# Patient Record
Sex: Male | Born: 1995 | Race: Black or African American | Hispanic: No | Marital: Single | Smoking: Never smoker
Health system: Southern US, Community
[De-identification: ages and names within clinical notes are randomized; demographics above are authoritative.]

## PROBLEM LIST (undated history)

## (undated) DIAGNOSIS — F329 Major depressive disorder, single episode, unspecified: Secondary | ICD-10-CM

## (undated) DIAGNOSIS — F32A Depression, unspecified: Secondary | ICD-10-CM

## (undated) DIAGNOSIS — K219 Gastro-esophageal reflux disease without esophagitis: Secondary | ICD-10-CM

## (undated) DIAGNOSIS — F200 Paranoid schizophrenia: Secondary | ICD-10-CM

## (undated) DIAGNOSIS — F419 Anxiety disorder, unspecified: Secondary | ICD-10-CM

## (undated) DIAGNOSIS — F209 Schizophrenia, unspecified: Secondary | ICD-10-CM

## (undated) DIAGNOSIS — J302 Other seasonal allergic rhinitis: Secondary | ICD-10-CM

## (undated) DIAGNOSIS — J45909 Unspecified asthma, uncomplicated: Secondary | ICD-10-CM

## (undated) HISTORY — DX: Schizophrenia, unspecified: F20.9

## (undated) HISTORY — DX: Unspecified asthma, uncomplicated: J45.909

## (undated) HISTORY — DX: Other seasonal allergic rhinitis: J30.2

## (undated) HISTORY — DX: Major depressive disorder, single episode, unspecified: F32.9

## (undated) HISTORY — DX: Gastro-esophageal reflux disease without esophagitis: K21.9

## (undated) HISTORY — PX: WISDOM TOOTH EXTRACTION: SHX21

## (undated) HISTORY — DX: Depression, unspecified: F32.A

## (undated) HISTORY — DX: Anxiety disorder, unspecified: F41.9

## (undated) HISTORY — DX: Paranoid schizophrenia: F20.0

---
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
## 2014-02-18 ENCOUNTER — Ambulatory Visit (HOSPITAL_COMMUNITY): Payer: Self-pay | Admitting: Psychiatry

## 2014-03-11 ENCOUNTER — Ambulatory Visit (INDEPENDENT_AMBULATORY_CARE_PROVIDER_SITE_OTHER): Payer: Medicaid Other | Admitting: Psychiatry

## 2014-03-11 ENCOUNTER — Encounter (HOSPITAL_COMMUNITY): Payer: Self-pay | Admitting: Psychiatry

## 2014-03-11 VITALS — BP 116/83 | HR 67 | Ht 68.5 in | Wt 224.0 lb

## 2014-03-11 DIAGNOSIS — F333 Major depressive disorder, recurrent, severe with psychotic symptoms: Secondary | ICD-10-CM | POA: Diagnosis not present

## 2014-03-11 MED ORDER — OLANZAPINE 2.5 MG PO TABS: ORAL_TABLET | ORAL | Status: DC

## 2014-03-11 MED ORDER — HYDROXYZINE PAMOATE 100 MG PO CAPS: 100.0000 mg | ORAL_CAPSULE | Freq: Every evening | ORAL | Status: DC | PRN

## 2014-03-11 NOTE — Progress Notes (Addendum)
   [facility] Follow-up Outpatient Visit  Zollie ScaleMichael D Arko 02/13/1996  Date:   03/10/14 Subjective: Pt is here for follow up Sleeping is fair; appetite is fair. Pt is now working during the summer.  Pt attends [employer], and making passing marks. 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 He is concerned with 85 lb weight gain with olanzapine. Pt doesn't feel he needs the medications. Pt reports that he does exercise. He says he is stress free. He denies SI/HI/AVH. Mom spoke to me in private, to tell provider that her son doesn't know his diagnosis, or why he is taking medications. Mom and son are well defended, and will continue to monitor patient's behavior. Agreed to lower olanzapine 2.5 mg hs; encouraged pt to eat healthy and to exercise. Pt denies depression, mania, or psychosis. He is tolerating the medications. Rtc in 4 weeks.   There were no vitals filed for this visit.  Mental Status Examination  Appearance: casual  Alert: Yes Attention: fair  Cooperative: Yes Eye Contact: Fair Speech: wdl  Psychomotor Activity: Normal Memory/Concentration: fair  Oriented: time/date and situation Mood: Anxious Affect: Constricted Thought Processes and Associations: Circumstantial Fund of Knowledge: Fair Thought Content: preoccupations Insight: Fair Judgement: Fair  Diagnosis:  MDD, recurrent, severe with psychosis  Treatment Plan:  Return to clinic in 4 weeks Olanzapine 2.5 mg po hs for psychosis Therapy Hydroxyzine 100 mg hs for sleep Kendrick FriesBLANKMANN, MEGHAN, NP

## 2014-04-16 ENCOUNTER — Ambulatory Visit (HOSPITAL_COMMUNITY): Payer: Self-pay | Admitting: Psychiatry

## 2014-05-15 ENCOUNTER — Ambulatory Visit (HOSPITAL_COMMUNITY): Payer: Self-pay | Admitting: Psychiatry

## 2014-05-26 ENCOUNTER — Ambulatory Visit (HOSPITAL_COMMUNITY): Payer: Self-pay | Admitting: Psychiatry

## 2014-07-27 ENCOUNTER — Other Ambulatory Visit (HOSPITAL_COMMUNITY): Payer: Self-pay | Admitting: *Deleted

## 2014-07-27 DIAGNOSIS — F333 Major depressive disorder, recurrent, severe with psychotic symptoms: Secondary | ICD-10-CM

## 2014-07-27 MED ORDER — OLANZAPINE 2.5 MG PO TABS: ORAL_TABLET | ORAL | Status: DC

## 2014-07-27 NOTE — Telephone Encounter (Signed)
Chart reviewed. Refill appropriate. Previous provider no longer with practice. Refilled per Dr. Remus BlakeKumar's orders

## 2014-08-04 ENCOUNTER — Encounter (HOSPITAL_COMMUNITY): Payer: Self-pay | Admitting: Psychiatry

## 2014-08-04 ENCOUNTER — Ambulatory Visit (INDEPENDENT_AMBULATORY_CARE_PROVIDER_SITE_OTHER): Payer: Medicaid Other | Admitting: Psychiatry

## 2014-08-04 VITALS — BP 123/73 | HR 62 | Ht 69.0 in | Wt 218.0 lb

## 2014-08-04 DIAGNOSIS — F3340 Major depressive disorder, recurrent, in remission, unspecified: Secondary | ICD-10-CM

## 2014-08-04 DIAGNOSIS — F200 Paranoid schizophrenia: Secondary | ICD-10-CM

## 2014-08-04 DIAGNOSIS — F333 Major depressive disorder, recurrent, severe with psychotic symptoms: Secondary | ICD-10-CM | POA: Insufficient documentation

## 2014-08-04 DIAGNOSIS — Z79899 Other long term (current) drug therapy: Secondary | ICD-10-CM

## 2014-08-04 MED ORDER — OLANZAPINE 5 MG PO TABS: ORAL_TABLET | ORAL | Status: DC

## 2014-08-04 MED ORDER — BUSPIRONE HCL 5 MG PO TABS: 10.0000 mg | ORAL_TABLET | Freq: Every day | ORAL | Status: DC

## 2014-08-04 NOTE — Progress Notes (Signed)
[facility] [id number] Progress Note  Michael Vega [id number] 18 y.o.  08/04/2014 10:33 AM  Chief Complaint: "I am good mentally, physically and emotionally"  History of Present Illness:Pt is a 18 yr old male with h/o MDD with psychosis and Schizophrenia paranoid type who presents today for a followup visit. He was last seen in June 2015.   Patient reports that he is doing well. Pt has taken a break from college for the last one month. He wants to think about what he wants to do in life before going back. He was laid off work b/c he wasn't able to handle the physical aspects of his job. Pt is looking for a new job.   He's also been taking his Zyprexa 5mg  as prescribed and denies SE.   Pt denies depression, low mood, isolation, anhedonia, worthlessness and crying spells.  He denies any paranoia, hallucinations at this visit.   States he is having trouble falling asleep and will fall asleep at 2am and will wake up 12 hrs later. He had stopped taking Vistaril for a while but tried it for the last 2 nights. It did not help him fall asleep any earlier.  Energy is good. Appetite is good.   Suicidal Ideation: No Plan Formed: No Patient has means to carry out plan: No  Homicidal Ideation: No Plan Formed: No Patient has means to carry out plan: No  Review of Systems: Psychiatric: Agitation: No Hallucination: No Depressed Mood: No Insomnia: No Hypersomnia: No Altered Concentration: No Feels Worthless: No Grandiose Ideas: No Belief In Special Powers: No New/Increased Substance Abuse: No Compulsions: No    Neurologic: Headache: No Seizure: No Paresthesias: No Review of Systems  Constitutional: Negative for fever and chills.  HENT: Negative for congestion, nosebleeds and sore throat.   Eyes: Negative for blurred vision, double vision and redness.  Respiratory: Negative for cough, sputum production and shortness of breath.   Cardiovascular: Negative for chest pain,  palpitations and leg swelling.  Gastrointestinal: Negative for heartburn, nausea, vomiting and abdominal pain.  Musculoskeletal: Negative for myalgias, back pain, joint pain and neck pain.  Skin: Negative for itching and rash.  Neurological: Negative for dizziness, tremors, seizures, weakness and headaches.  Psychiatric/Behavioral: Negative for depression, suicidal ideas, hallucinations and substance abuse. The patient has insomnia. The patient is not nervous/anxious.       Past Medical Family, Social History: lives with his mom, grandmother, and  Little sister.  reports that he has never smoked. He has never used smokeless tobacco. He reports that he does not drink alcohol or use illicit drugs.  Family History  Problem Relation Age of Onset  . ADD / ADHD Neg Hx   . Alcohol abuse Neg Hx   . Anxiety disorder Neg Hx   . Bipolar disorder Neg Hx   . Depression Neg Hx   . Schizophrenia Neg Hx    Past Medical History  Diagnosis Date  . Schizophrenia   . Depression      Outpatient Encounter Prescriptions as of 08/04/2014  Medication Sig  . hydrOXYzine (VISTARIL) 100 MG capsule Take 1 capsule (100 mg total) by mouth at bedtime as needed (sleep).  . OLANZapine (ZYPREXA) 2.5 MG tablet TAKE 1 TABLET (5 MG TOTAL) BY MOUTH AT BEDTIME.    Past Psychiatric History/Hospitalization(s): Anxiety: No Bipolar Disorder: No Depression: Yes Mania: No Psychosis: Yes Schizophrenia: Yes Personality Disorder: No Hospitalization for psychiatric illness: Yes History of Electroconvulsive Shock Therapy: No Prior Suicide Attempts: Yes  Physical  Exam: Constitutional:  BP 123/73 mmHg  Pulse 62  Ht 5\' 9"  (1.753 m)  Wt 218 lb (98.884 kg)  BMI 32.18 kg/m2  General Appearance: alert, oriented, no acute distress and well nourished  Musculoskeletal: Strength & Muscle Tone: within normal limits Gait & Station: normal Patient leans: N/A  Mental Status Examination/Evaluation: Objective: Attitude:  Calm and cooperative  Appearance: Fairly Groomed, appears to be stated age  Patent attorneyye Contact::  Fair  Speech:  Clear and Coherent and Normal Rate  Volume:  Normal  Mood:  euthymic  Affect:  Full Range  Thought Process:  Linear and Logical  Orientation:  Full (Time, Place, and Person)  Thought Content:  Negative  Suicidal Thoughts:  No  Homicidal Thoughts:  No  Judgement:  Fair  Insight:  Fair  Concentration: good  Memory: Immediate-fair Recent-fair Remote-fair  Recall: fair  Language: fair  Gait and Station: normal  [employer] of Knowledge: average  Psychomotor Activity:  Normal  Akathisia:  No  Handed:  Right  AIMS (if indicated):  Facial and Oral Movements  Muscles of Facial Expression: None, normal  Lips and Perioral Area: None, normal  Jaw: None, normal  Tongue: None, normal Extremity Movements: Upper (arms, wrists, hands, fingers): None, normal  Lower (legs, knees, ankles, toes): None, normal,  Trunk Movements:  Neck, shoulders, hips: None, normal,  Overall Severity : Severity of abnormal movements (highest score from questions above): None, normal  Incapacitation due to abnormal movements: None, normal  Patient's awareness of abnormal movements (rate only patient's report): No Awareness, Dental Status  Current problems with teeth and/or dentures?: No  Does patient usually wear dentures?: No    Assets:  Communication Skills Desire for Improvement Financial Resources/Insurance Housing Intimacy Leisure Time Physical Health Resilience Social Support Talents/Skills       Medical Decision Making (Choose Three): Established Problem, Stable/Improving (1), Review of Psycho-Social Stressors (1), Review of Last Therapy Session (1) and Review of Medication Regimen & Side Effects (2)  Assessment: Axis I: Schizophrenia, paranoid type; MDD- recurrent, severe with psychosis- in remission  Axis II: Deferred  Axis III:  Past Medical History  Diagnosis Date  .  Schizophrenia   . Depression    Axis IV: limited coping skills  Axis V: 70   Plan: Continue Zyprexa 5 MG PO qHS for depression and mood stabilization D/c Vistaril as is it no longer effect for sleep Start trial of Buspar 10mg  po qHS for sleep  Medication management with supportive therapy. Risks/benefits and SE of the medication discussed. Pt verbalized understanding and verbal consent obtained for treatment.  Affirm with the patient that the medications are taken as ordered. Patient expressed understanding of how their medications were to be used.  -improvement of symptoms  Labs: Antipsychotic- CBC, CMP, HbA1c, Lipid panel, TSH, Prolactin level, EKG  Therapy: brief supportive therapy provided. Discussed psychosocial stressors in detail.    Pt denies SI and is at an acute low risk for suicide.Patient told to call clinic if any problems occur. Patient advised to go to ER if they should develop SI/HI, side effects, or if symptoms worsen. Has crisis numbers to call if needed. Pt verbalized understanding.  F/up in 3 months or sooner if needed   Oletta DarterAGARWAL, Chalyn Amescua, MD 08/04/2014

## 2014-11-05 ENCOUNTER — Ambulatory Visit (HOSPITAL_COMMUNITY): Payer: Self-pay | Admitting: Psychiatry

## 2014-11-10 ENCOUNTER — Encounter (HOSPITAL_COMMUNITY): Payer: Self-pay | Admitting: Psychiatry

## 2014-11-10 ENCOUNTER — Ambulatory Visit (INDEPENDENT_AMBULATORY_CARE_PROVIDER_SITE_OTHER): Payer: Federal, State, Local not specified - PPO | Admitting: Psychiatry

## 2014-11-10 VITALS — BP 136/82 | HR 85 | Ht 68.5 in | Wt 225.0 lb

## 2014-11-10 DIAGNOSIS — F333 Major depressive disorder, recurrent, severe with psychotic symptoms: Secondary | ICD-10-CM

## 2014-11-10 DIAGNOSIS — F3342 Major depressive disorder, recurrent, in full remission: Secondary | ICD-10-CM | POA: Diagnosis not present

## 2014-11-10 DIAGNOSIS — Z79899 Other long term (current) drug therapy: Secondary | ICD-10-CM

## 2014-11-10 DIAGNOSIS — F200 Paranoid schizophrenia: Secondary | ICD-10-CM

## 2014-11-10 MED ORDER — OLANZAPINE 5 MG PO TABS: ORAL_TABLET | ORAL | Status: DC

## 2014-11-10 NOTE — Patient Instructions (Signed)
Call [phone] for EKG 

## 2014-11-10 NOTE — Progress Notes (Signed)
[facility] [id number] Progress Note  Michael Vega [id number] 19 y.o.  11/10/2014 4:23 PM  Chief Complaint: "I am good "  History of Present Illness:Pt is a 19 yr old male with h/o MDD with psychosis and Schizophrenia paranoid type who presents today for a followup visit.  Patient reports that he is doing well. Pt is taking a full course load at [employer] and is studying sociology. He enjoys it and classes are going well. 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   He's also been taking his Zyprexa  as prescribed and denies SE. Pt is not taking Buspar.   Pt denies depression, sad mood, isolation, anhedonia, worthlessness, hopelessness and crying spells.  He denies any paranoia, ideas of reference, hallucinations at this visit.   Sleep is good and is practicing good sleep hygeine. Pt is sleeping about 8 hrs/night. Energy is good and he is exercising a couple days a week. Appetite and concentration are good.   Suicidal Ideation: No Plan Formed: No Patient has means to carry out plan: No  Homicidal Ideation: No Plan Formed: No Patient has means to carry out plan: No  Review of Systems: Psychiatric: Agitation: No Hallucination: No Depressed Mood: No Insomnia: No Hypersomnia: No Altered Concentration: No Feels Worthless: No Grandiose Ideas: No Belief In Special Powers: No New/Increased Substance Abuse: No Compulsions: No    Neurologic: Headache: Yes rare Seizure: No Paresthesias: No Review of Systems  Constitutional: Negative for fever, chills, weight loss and malaise/fatigue.  HENT: Negative for congestion, nosebleeds and sore throat.   Eyes: Negative for blurred vision, double vision and redness.  Respiratory: Negative for cough, shortness of breath and wheezing.   Cardiovascular: Negative for chest pain, palpitations and leg swelling.  Gastrointestinal: Negative for heartburn, nausea, vomiting and abdominal pain.  Musculoskeletal:  Negative for back pain, joint pain and neck pain.  Skin: Negative for itching and rash.  Neurological: Positive for headaches. Negative for dizziness, focal weakness, seizures and loss of consciousness.  Psychiatric/Behavioral: Negative for depression, suicidal ideas, hallucinations and substance abuse. The patient is not nervous/anxious and does not have insomnia.       Past Medical Family, Social History: lives with his mom, grandmother, and  Little sister.  reports that he has never smoked. He has never used smokeless tobacco. He reports that he does not drink alcohol or use illicit drugs.  Family History  Problem Relation Age of Onset  . ADD / ADHD Neg Hx   . Alcohol abuse Neg Hx   . Anxiety disorder Neg Hx   . Bipolar disorder Neg Hx   . Depression Neg Hx   . Schizophrenia Neg Hx    Past Medical History  Diagnosis Date  . Schizophrenia   . Depression      Outpatient Encounter Prescriptions as of 11/10/2014  Medication Sig  . OLANZapine (ZYPREXA) 5 MG tablet TAKE 1 TABLET (5 MG TOTAL) BY MOUTH AT BEDTIME.  . busPIRone (BUSPAR) 5 MG tablet Take 2 tablets (10 mg total) by mouth at bedtime. (Patient not taking: Reported on 11/10/2014)    Past Psychiatric History/Hospitalization(s): Anxiety: No Bipolar Disorder: No Depression: Yes Mania: No Psychosis: Yes Schizophrenia: Yes Personality Disorder: No Hospitalization for psychiatric illness: Yes History of Electroconvulsive Shock Therapy: No Prior Suicide Attempts: Yes  Physical Exam: Constitutional:  BP 136/82 mmHg  Pulse 85  Ht 5' 8.5" (1.74 m)  Wt 225 lb (102.059 kg)  BMI 33.71 kg/m2  General Appearance: alert,  oriented, no acute distress and well nourished  Musculoskeletal: Strength & Muscle Tone: within normal limits Gait & Station: normal Patient leans: N/A  Mental Status Examination/Evaluation: Objective: Attitude: Calm and cooperative  Appearance: Fairly Groomed, appears to be stated age  Patent attorneyye Contact::   Fair  Speech:  Clear and Coherent and Normal Rate  Volume:  Normal  Mood:  euthymic  Affect:  Full Range  Thought Process:  Linear and Logical  Orientation:  Full (Time, Place, and Person)  Thought Content:  Negative  Suicidal Thoughts:  No  Homicidal Thoughts:  No  Judgement:  Fair  Insight:  Fair  Concentration: good  Memory: Immediate-fair Recent-fair Remote-fair  Recall: fair  Language: fair  Gait and Station: normal  [employer] of Knowledge: average  Psychomotor Activity:  Normal  Akathisia:  No  Handed:  Right  AIMS (if indicated):  Facial and Oral Movements  Muscles of Facial Expression: None, normal  Lips and Perioral Area: None, normal  Jaw: None, normal  Tongue: None, normal Extremity Movements: Upper (arms, wrists, hands, fingers): None, normal  Lower (legs, knees, ankles, toes): None, normal,  Trunk Movements:  Neck, shoulders, hips: None, normal,  Overall Severity : Severity of abnormal movements (highest score from questions above): None, normal  Incapacitation due to abnormal movements: None, normal  Patient's awareness of abnormal movements (rate only patient's report): No Awareness, Dental Status  Current problems with teeth and/or dentures?: No  Does patient usually wear dentures?: No    Assets:  Communication Skills Desire for Improvement Financial Resources/Insurance Housing Intimacy Leisure Time Physical Health Resilience Social Support Talents/Skills       Medical Decision Making (Choose Three): Established Problem, Stable/Improving (1), Review of Psycho-Social Stressors (1), Review or order clinical lab tests (1), Order AIMS Test (2) and Review of Medication Regimen & Side Effects (2)  Assessment: Axis I: Schizophrenia, paranoid type; MDD- recurrent, severe with psychosis- in remission  Axis II: Deferred  Axis III:  Past Medical History  Diagnosis Date  . Schizophrenia   . Depression    Axis IV: limited coping skills  Axis  V: 70   Plan: Continue Zyprexa 5 MG PO qHS for depression and mood stabilization D/c  Buspar 10mg  po qHS for sleep  Medication management with supportive therapy. Risks/benefits and SE of the medication discussed. Pt verbalized understanding and verbal consent obtained for treatment.  Affirm with the patient that the medications are taken as ordered. Patient expressed understanding of how their medications were to be used.  -improvement of symptoms  Labs: Antipsychotic- CBC, CMP, HbA1c, Lipid panel, TSH, Prolactin level, EKG  Therapy: brief supportive therapy provided. Discussed psychosocial stressors in detail.    Pt denies SI and is at an acute low risk for suicide.Patient told to call clinic if any problems occur. Patient advised to go to ER if they should develop SI/HI, side effects, or if symptoms worsen. Has crisis numbers to call if needed. Pt verbalized understanding.  F/up in 3 months or sooner if needed   Oletta DarterAGARWAL, Darshawn Boateng, MD 11/10/2014

## 2015-02-09 ENCOUNTER — Ambulatory Visit (HOSPITAL_COMMUNITY): Payer: Self-pay | Admitting: Psychiatry

## 2015-02-15 ENCOUNTER — Other Ambulatory Visit (HOSPITAL_COMMUNITY): Payer: Self-pay | Admitting: Psychiatry

## 2015-02-23 ENCOUNTER — Ambulatory Visit (HOSPITAL_COMMUNITY): Payer: Self-pay | Admitting: Psychiatry

## 2015-03-05 ENCOUNTER — Ambulatory Visit (INDEPENDENT_AMBULATORY_CARE_PROVIDER_SITE_OTHER): Payer: Federal, State, Local not specified - PPO | Admitting: Family Medicine

## 2015-03-05 ENCOUNTER — Encounter: Payer: Self-pay | Admitting: Family Medicine

## 2015-03-05 VITALS — BP 108/60 | HR 68 | Temp 97.5°F | Resp 16 | Ht 69.0 in | Wt 222.4 lb

## 2015-03-05 DIAGNOSIS — F333 Major depressive disorder, recurrent, severe with psychotic symptoms: Secondary | ICD-10-CM

## 2015-03-05 NOTE — Progress Notes (Signed)
Subjective:     Patient ID: Michael Vega, male   DOB: 06-27-1996, 19 y.o.   MRN: [id number]  HPI  Chief Complaint  Patient presents with  . Follow-up    Patient is here in office to follow up from psychiatrist. Patient is currently on Zyprexa 5mg  and is being monitored, specialist is requesting that PCP order EKG and labs.   States he feels well with symptoms controlled by medication. 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   Review of Systems  Respiratory: Negative for shortness of breath.   Cardiovascular: Negative for chest pain and palpitations.       Objective:   Physical Exam  Constitutional: He appears well-developed and well-nourished. No distress.  Cardiovascular: Normal rate and regular rhythm.   Pulmonary/Chest: Breath sounds normal.  Musculoskeletal: He exhibits no edema (in lower extremties).       Assessment:     1. MDD (major depressive disorder), recurrent, severe, with psychosis  - EKG 12-Lead - CBC with Differential/Platelet - COMPLETE METABOLIC PANEL WITH GFR - Prolactin - Lipid panel - Hemoglobin A1c    Plan:     Further f/u pending lab work. Copy of EKG sent with patient for psychiatrist review

## 2015-03-09 ENCOUNTER — Encounter (HOSPITAL_COMMUNITY): Payer: Self-pay | Admitting: Psychiatry

## 2015-03-09 ENCOUNTER — Ambulatory Visit (INDEPENDENT_AMBULATORY_CARE_PROVIDER_SITE_OTHER): Payer: Federal, State, Local not specified - PPO | Admitting: Psychiatry

## 2015-03-09 VITALS — BP 126/76 | HR 75 | Ht 69.0 in | Wt 221.4 lb

## 2015-03-09 DIAGNOSIS — F3342 Major depressive disorder, recurrent, in full remission: Secondary | ICD-10-CM

## 2015-03-09 DIAGNOSIS — F200 Paranoid schizophrenia: Secondary | ICD-10-CM

## 2015-03-09 MED ORDER — OLANZAPINE 5 MG PO TABS: ORAL_TABLET | ORAL | Status: DC

## 2015-03-09 NOTE — Progress Notes (Signed)
Patient ID: Michael Vega, male   DOB: May 03, 1996, 19 y.o.   MRN: [id number] [facility] [id number] Progress Note  Michael Vega [id number] 19 y.o.  03/09/2015 11:09 AM  Chief Complaint: "I am good "  History of Present Illness:Pt is a 19 yr old male with h/o MDD with psychosis and Schizophrenia paranoid type who presents today for a followup visit.  Patient reports that he is doing well. Pt is out for the summer from [facility]. He is still thinking about the future and trying to decide if he wants to go to college. 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   He's also been taking his Zyprexa 5mg  as prescribed and denies SE.   Pt denies depression, sad mood, isolation, anhedonia, worthlessness, hopelessness and crying spells.  He denies any anxiety,  paranoia, ideas of reference, hallucinations at this visit.   Sleep is good and is practicing good sleep hygeine. Pt is sleeping about 8-10 hrs/night. Energy is good. Appetite and concentration are good. No longer drinking sodas.   Suicidal Ideation: No Plan Formed: No Patient has means to carry out plan: No  Homicidal Ideation: No Plan Formed: No Patient has means to carry out plan: No  Review of Systems: Psychiatric: Agitation: No Hallucination: No Depressed Mood: No Insomnia: No Hypersomnia: No Altered Concentration: No Feels Worthless: No Grandiose Ideas: No Belief In Special Powers: No New/Increased Substance Abuse: No Compulsions: No    Neurologic: Headache: No  Seizure: No Paresthesias: No Review of Systems  Constitutional: Negative for fever, chills, weight loss and malaise/fatigue.  HENT: Negative for congestion, nosebleeds and sore throat.   Eyes: Negative for blurred vision, double vision and redness.  Respiratory: Negative for cough, shortness of breath and wheezing.   Cardiovascular: Negative for chest pain, palpitations and leg swelling.  Gastrointestinal:  Negative for heartburn, nausea, vomiting and abdominal pain.  Musculoskeletal: Negative for back pain, joint pain and neck pain.  Skin: Negative for itching and rash.  Neurological: Negative for dizziness, focal weakness, seizures, loss of consciousness and headaches.  Psychiatric/Behavioral: Negative for depression, suicidal ideas, hallucinations and substance abuse. The patient is not nervous/anxious and does not have insomnia.       Past Medical Family, Social History: lives with his mom, grandmother, and Little sister.  reports that he has never smoked. He has never used smokeless tobacco. He reports that he does not drink alcohol or use illicit drugs. Working at [employer].   Family History  Problem Relation Age of Onset  . ADD / ADHD Neg Hx   . Alcohol abuse Neg Hx   . Anxiety disorder Neg Hx   . Bipolar disorder Neg Hx   . Depression Neg Hx   . Schizophrenia Neg Hx    Past Medical History  Diagnosis Date  . Schizophrenia   . Depression   . Schizophrenia, paranoid      Outpatient Encounter Prescriptions as of 03/09/2015  Medication Sig  . OLANZapine (ZYPREXA) 5 MG tablet TAKE 1 TABLET (5 MG TOTAL) BY MOUTH AT BEDTIME.   No facility-administered encounter medications on file as of 03/09/2015.    Past Psychiatric History/Hospitalization(s): Anxiety: No Bipolar Disorder: No Depression: Yes Mania: No Psychosis: Yes Schizophrenia: Yes Personality Disorder: No Hospitalization for psychiatric illness: Yes History of Electroconvulsive Shock Therapy: No Prior Suicide Attempts: Yes  Physical Exam: Constitutional:  BP 126/76 mmHg  Pulse 75  Ht 5\' 9"  (1.753 m)  Wt  221 lb 6.4 oz (100.426 kg)  BMI 32.68 kg/m2  General Appearance: alert, oriented, no acute distress and well nourished  Musculoskeletal: Strength & Muscle Tone: within normal limits Gait & Station: normal Patient leans: N/A  Mental Status Examination/Evaluation: Objective: Attitude: Calm and  cooperative  Appearance: Fairly Groomed, appears to be stated age  Patent attorney::  Fair  Speech:  Clear and Coherent and Normal Rate  Volume:  Normal  Mood:  euthymic  Affect:  Full Range  Thought Process:  Linear and Logical  Orientation:  Full (Time, Place, and Person)  Thought Content:  Negative  Suicidal Thoughts:  No  Homicidal Thoughts:  No  Judgement:  Fair  Insight:  Fair  Concentration: good  Memory: Immediate-fair Recent-fair Remote-fair  Recall: fair  Language: fair  Gait and Station: normal  [employer] of Knowledge: average  Psychomotor Activity:  Normal  Akathisia:  No  Handed:  Right  AIMS (if indicated):  Facial and Oral Movements  Muscles of Facial Expression: None, normal  Lips and Perioral Area: None, normal  Jaw: None, normal  Tongue: None, normal Extremity Movements: Upper (arms, wrists, hands, fingers): None, normal  Lower (legs, knees, ankles, toes): None, normal,  Trunk Movements:  Neck, shoulders, hips: None, normal,  Overall Severity : Severity of abnormal movements (highest score from questions above): None, normal  Incapacitation due to abnormal movements: None, normal  Patient's awareness of abnormal movements (rate only patient's report): No Awareness, Dental Status  Current problems with teeth and/or dentures?: No  Does patient usually wear dentures?: No    Assets:  Communication Skills Desire for Improvement Financial Resources/Insurance Housing Intimacy Leisure Time Physical Health Resilience Social Support Talents/Skills       Medical Decision Making (Choose Three): Established Problem, Stable/Improving (1), Review of Psycho-Social Stressors (1), Review or order clinical lab tests (1) and Review of Medication Regimen & Side Effects (2)  Assessment: Axis I: Schizophrenia, paranoid type; MDD- recurrent, severe with psychosis- in remission  Axis II: Deferred   Plan: Continue Zyprexa 5 MG PO qHS for depression and mood  stabilization  Medication management with supportive therapy. Risks/benefits and SE of the medication discussed. Pt verbalized understanding and verbal consent obtained for treatment.  Affirm with the patient that the medications are taken as ordered. Patient expressed understanding of how their medications were to be used.  -improvement of symptoms  Labs: reviewed CBC WNL, CMP WNL, HbA1c WNL, Lipid panel Trig 146, TSH WNL, Prolactin level WNL  EKG reviewed done on 03/05/2015 Sinus bradycardia, QTc 392  Therapy: brief supportive therapy provided. Discussed psychosocial stressors in detail.   Recommend diet and exercise for elevated triglycercides  Pt denies SI and is at an acute low risk for suicide.Patient told to call clinic if any problems occur. Patient advised to go to ER if they should develop SI/HI, side effects, or if symptoms worsen. Has crisis numbers to call if needed. Pt verbalized understanding.  F/up in 6 months or sooner if needed   Oletta Darter, MD 03/09/2015

## 2015-03-11 NOTE — Addendum Note (Signed)
Addended by: Jaclyn Prime on: 03/11/2015 11:29 AM   Modules accepted: Orders

## 2015-03-17 ENCOUNTER — Encounter: Payer: Self-pay | Admitting: Psychiatry

## 2015-03-18 ENCOUNTER — Telehealth: Payer: Self-pay

## 2015-03-18 NOTE — Telephone Encounter (Signed)
Pt is returning your call. I could not find any message in the chart. Allene Dillon, CMA

## 2015-03-23 ENCOUNTER — Ambulatory Visit (HOSPITAL_COMMUNITY): Payer: Self-pay | Admitting: Psychiatry

## 2015-09-06 ENCOUNTER — Other Ambulatory Visit (HOSPITAL_COMMUNITY): Payer: Self-pay | Admitting: Psychiatry

## 2015-09-09 ENCOUNTER — Ambulatory Visit (HOSPITAL_COMMUNITY): Payer: Self-pay | Admitting: Psychiatry

## 2015-10-01 ENCOUNTER — Ambulatory Visit (INDEPENDENT_AMBULATORY_CARE_PROVIDER_SITE_OTHER): Payer: Federal, State, Local not specified - PPO | Admitting: Physician Assistant

## 2015-10-01 ENCOUNTER — Encounter: Payer: Self-pay | Admitting: Physician Assistant

## 2015-10-01 VITALS — BP 112/70 | HR 104 | Temp 98.1°F | Resp 16 | Wt 234.8 lb

## 2015-10-01 DIAGNOSIS — J069 Acute upper respiratory infection, unspecified: Secondary | ICD-10-CM

## 2015-10-01 MED ORDER — AZITHROMYCIN 250 MG PO TABS: ORAL_TABLET | ORAL | Status: DC

## 2015-10-01 NOTE — Patient Instructions (Signed)
Upper Respiratory Infection, Adult Most upper respiratory infections (URIs) are a viral infection of the air passages leading to the lungs. A URI affects the nose, throat, and upper air passages. The most common type of URI is nasopharyngitis and is typically referred to as "the common cold." URIs run their course and usually go away on their own. Most of the time, a URI does not require medical attention, but sometimes a bacterial infection in the upper airways can follow a viral infection. This is called a secondary infection. Sinus and middle ear infections are common types of secondary upper respiratory infections. Bacterial pneumonia can also complicate a URI. A URI can worsen asthma and chronic obstructive pulmonary disease (COPD). Sometimes, these complications can require emergency medical care and may be life threatening.  CAUSES Almost all URIs are caused by viruses. A virus is a type of germ and can spread from one person to another.  RISKS FACTORS You may be at risk for a URI if:   You smoke.   You have chronic heart or lung disease.  You have a weakened defense (immune) system.   You are very young or very old.   You have nasal allergies or asthma.  You work in crowded or poorly ventilated areas.  You work in health care facilities or schools. SIGNS AND SYMPTOMS  Symptoms typically develop 2-3 days after you come in contact with a cold virus. Most viral URIs last 7-10 days. However, viral URIs from the influenza virus (flu virus) can last 14-18 days and are typically more severe. Symptoms may include:   Runny or stuffy (congested) nose.   Sneezing.   Cough.   Sore throat.   Headache.   Fatigue.   Fever.   Loss of appetite.   Pain in your forehead, behind your eyes, and over your cheekbones (sinus pain).  Muscle aches.  DIAGNOSIS  Your health care provider may diagnose a URI by:  Physical exam.  Tests to check that your symptoms are not due to  another condition such as:  Strep throat.  Sinusitis.  Pneumonia.  Asthma. TREATMENT  A URI goes away on its own with time. It cannot be cured with medicines, but medicines may be prescribed or recommended to relieve symptoms. Medicines may help:  Reduce your fever.  Reduce your cough.  Relieve nasal congestion. HOME CARE INSTRUCTIONS   Take medicines only as directed by your health care provider.   Gargle warm saltwater or take cough drops to comfort your throat as directed by your health care provider.  Use a warm mist humidifier or inhale steam from a shower to increase air moisture. This may make it easier to breathe.  Drink enough fluid to keep your urine clear or pale yellow.   Eat soups and other clear broths and maintain good nutrition.   Rest as needed.   Return to work when your temperature has returned to normal or as your health care provider advises. You may need to stay home longer to avoid infecting others. You can also use a face mask and careful hand washing to prevent spread of the virus.  Increase the usage of your inhaler if you have asthma.   Do not use any tobacco products, including cigarettes, chewing tobacco, or electronic cigarettes. If you need help quitting, ask your health care provider. PREVENTION  The best way to protect yourself from getting a cold is to practice good hygiene.   Avoid oral or hand contact with people with cold   symptoms.   Wash your hands often if contact occurs.  There is no clear evidence that vitamin C, vitamin E, echinacea, or exercise reduces the chance of developing a cold. However, it is always recommended to get plenty of rest, exercise, and practice good nutrition.  SEEK MEDICAL CARE IF:   You are getting worse rather than better.   Your symptoms are not controlled by medicine.   You have chills.  You have worsening shortness of breath.  You have brown or red mucus.  You have yellow or brown nasal  discharge.  You have pain in your face, especially when you bend forward.  You have a fever.  You have swollen neck glands.  You have pain while swallowing.  You have white areas in the back of your throat. SEEK IMMEDIATE MEDICAL CARE IF:   You have severe or persistent:  Headache.  Ear pain.  Sinus pain.  Chest pain.  You have chronic lung disease and any of the following:  Wheezing.  Prolonged cough.  Coughing up blood.  A change in your usual mucus.  You have a stiff neck.  You have changes in your:  Vision.  Hearing.  Thinking.  Mood. MAKE SURE YOU:   Understand these instructions.  Will watch your condition.  Will get help right away if you are not doing well or get worse.   This information is not intended to replace advice given to you by your health care provider. Make sure you discuss any questions you have with your health care provider.   Document Released: 03/07/2001 Document Revised: 01/26/2015 Document Reviewed: 12/17/2013 Elsevier Interactive Patient Education 2016 Elsevier Inc.  

## 2015-10-01 NOTE — Progress Notes (Signed)
Patient: Michael Vega Male    DOB: 1995/12/25   20 y.o.   MRN: [id number] Visit Date: 10/01/2015  Today's Provider: Margaretann Loveless, PA-C   Chief Complaint  Patient presents with  . Sinusitis   Subjective:    Sinusitis This is a new problem. The current episode started in the past 7 days. The problem has been gradually worsening since onset. There has been no fever. Associated symptoms include congestion, coughing, headaches, a hoarse voice, sinus pressure, sneezing and a sore throat. Pertinent negatives include no chills, ear pain or shortness of breath.  He has tried Sudafed and Mucinex for the symptoms without relief. He does state Mucinex gave him a headache.    No Known Allergies Previous Medications   OLANZAPINE (ZYPREXA) 5 MG TABLET    TAKE 1 TABLET (5 MG TOTAL) BY MOUTH AT BEDTIME.    Review of Systems  Constitutional: Positive for fatigue. Negative for fever and chills.  HENT: Positive for congestion, hoarse voice, rhinorrhea, sinus pressure, sneezing and sore throat. Negative for ear discharge, ear pain, postnasal drip and voice change.   Eyes: Negative.        Watering eyes  Respiratory: Positive for cough. Negative for chest tightness, shortness of breath and wheezing.   Cardiovascular: Negative.   Gastrointestinal: Negative.   Neurological: Positive for headaches. Negative for dizziness.  All other systems reviewed and are negative.   Social History  Substance Use Topics  . Smoking status: Never Smoker   . Smokeless tobacco: Never Used  . Alcohol Use: No   Objective:   BP 112/70 mmHg  Pulse 104  Temp(Src) 98.1 F (36.7 C) (Oral)  Resp 16  Wt 234 lb 12.8 oz (106.505 kg)  SpO2 97%  Physical Exam  Constitutional: He appears well-developed and well-nourished. No distress.  HENT:  Head: Normocephalic and atraumatic.  Right Ear: Hearing, external ear and ear canal normal. Tympanic membrane is not erythematous and not bulging. A middle ear  effusion is present.  Left Ear: Hearing, external ear and ear canal normal. Tympanic membrane is bulging. Tympanic membrane is not erythematous. A middle ear effusion is present.  Nose: Mucosal edema and rhinorrhea present. Right sinus exhibits no maxillary sinus tenderness and no frontal sinus tenderness. Left sinus exhibits no maxillary sinus tenderness and no frontal sinus tenderness.  Mouth/Throat: Uvula is midline, oropharynx is clear and moist and mucous membranes are normal. No oropharyngeal exudate, posterior oropharyngeal edema or posterior oropharyngeal erythema.  Eyes: Conjunctivae and EOM are normal. Pupils are equal, round, and reactive to light. Right eye exhibits no discharge. Left eye exhibits no discharge.  Neck: Normal range of motion. Neck supple. No JVD present. No tracheal deviation present. No Brudzinski's sign and no Kernig's sign noted. No thyromegaly present.  Cardiovascular: Normal rate, regular rhythm and normal heart sounds.  Exam reveals no gallop and no friction rub.   No murmur heard. Pulmonary/Chest: Effort normal and breath sounds normal. No stridor. No respiratory distress. He has no wheezes. He has no rales. He exhibits no tenderness.  Lymphadenopathy:    He has no cervical adenopathy.  Skin: Skin is warm and dry.  Vitals reviewed.       Assessment & Plan:     1. Upper respiratory infection Worsening symptoms without response to OTC medications.  Will treat with z-pak as below. Advised to continue using sudafed for congestion. He is to stay well hydrated and get plenty of rest.  He is  to call the office in the meantime if symptoms worsen or fail to improve. - azithromycin (ZITHROMAX) 250 MG tablet; Take 2 tablets PO on day one, and one tablet PO daily thereafter until completed.  Dispense: 6 tablet; Refill: 0       Margaretann LovelessJennifer M Sirinity Outland, PA-C  [facility]

## 2015-10-07 ENCOUNTER — Encounter (HOSPITAL_COMMUNITY): Payer: Self-pay | Admitting: Psychiatry

## 2015-10-07 ENCOUNTER — Ambulatory Visit (INDEPENDENT_AMBULATORY_CARE_PROVIDER_SITE_OTHER): Payer: Federal, State, Local not specified - PPO | Admitting: Psychiatry

## 2015-10-07 VITALS — BP 122/84 | HR 93 | Ht 69.0 in | Wt 226.8 lb

## 2015-10-07 DIAGNOSIS — F3340 Major depressive disorder, recurrent, in remission, unspecified: Secondary | ICD-10-CM

## 2015-10-07 DIAGNOSIS — F339 Major depressive disorder, recurrent, unspecified: Secondary | ICD-10-CM

## 2015-10-07 DIAGNOSIS — F200 Paranoid schizophrenia: Secondary | ICD-10-CM | POA: Diagnosis not present

## 2015-10-07 MED ORDER — OLANZAPINE 5 MG PO TABS: ORAL_TABLET | ORAL | Status: DC

## 2015-10-07 NOTE — Progress Notes (Signed)
[facility] [id number] Progress Note  KAICEN DESENA [id number] 20 y.o.  10/07/2015 11:22 AM  Chief Complaint: "I am good "  History of Present Illness: Pt is a 20 yr old male with h/o MDD with psychosis and Schizophrenia paranoid type who presents today for a followup visit.  Patient reports that he is doing well. 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   He's also been taking his Zyprexa 5mg  as prescribed endorsing SE of weight gain. States it has helped with mood and battling the Schizophrenia and he likes it except for the weight gain. He reports no motivation to exercise.   States he is happy. Pt denies depression, sad mood, isolation, anhedonia, worthlessness, hopelessness and crying spells.  He denies any anxiety,  paranoia, ideas of reference, hallucinations at this visit.   Sleep is good and is practicing good sleep hygeine. Pt is sleeping about 8-10 hrs/night. Energy is good. Appetite is increased and he never feels full. He usually eats 2 meals a day and he eats 3-4 snacks per day. Concentration  good. No longer drinking sodas.   Suicidal Ideation: No Plan Formed: No Patient has means to carry out plan: No  Homicidal Ideation: No Plan Formed: No Patient has means to carry out plan: No  Review of Systems: Psychiatric: Agitation: No Hallucination: No Depressed Mood: No Insomnia: No Hypersomnia: No Altered Concentration: No Feels Worthless: No Grandiose Ideas: No Belief In Special Powers: No New/Increased Substance Abuse: No Compulsions: No    Neurologic: Headache: No  Seizure: No Paresthesias: No Review of Systems  Constitutional: Negative for fever, chills, weight loss and malaise/fatigue.  HENT: Negative for congestion, nosebleeds and sore throat.   Eyes: Negative for blurred vision, double vision and redness.  Respiratory: Negative for cough, shortness of breath and wheezing.    Cardiovascular: Negative for chest pain, palpitations and leg swelling.  Gastrointestinal: Negative for heartburn, nausea, vomiting and abdominal pain.  Musculoskeletal: Negative for back pain, joint pain and neck pain.  Skin: Negative for itching and rash.  Neurological: Negative for dizziness, focal weakness, seizures, loss of consciousness and headaches.  Psychiatric/Behavioral: Negative for depression, suicidal ideas, hallucinations and substance abuse. The patient is not nervous/anxious and does not have insomnia.       Past Medical Family, Social History: lives with his mom, grandmother, and Little sister.  reports that he has never smoked. He has never used smokeless tobacco. He reports that he does not drink alcohol or use illicit drugs. Working at [employer].   Family History  Problem Relation Age of Onset  . ADD / ADHD Neg Hx   . Alcohol abuse Neg Hx   . Anxiety disorder Neg Hx   . Bipolar disorder Neg Hx   . Depression Neg Hx   . Schizophrenia Neg Hx    Past Medical History  Diagnosis Date  . Schizophrenia (HCC)   . Depression   . Schizophrenia, paranoid [facility])      Outpatient Encounter Prescriptions as of 10/07/2015  Medication Sig  . OLANZapine (ZYPREXA) 5 MG tablet TAKE 1 TABLET (5 MG TOTAL) BY MOUTH AT BEDTIME.  Marland Kitchen azithromycin (ZITHROMAX) 250 MG tablet Take 2 tablets PO on day one, and one tablet PO daily thereafter until completed. (Patient not taking: Reported on 10/07/2015)   No facility-administered encounter medications on file as of 10/07/2015.    Past Psychiatric History/Hospitalization(s): Anxiety: No Bipolar Disorder:  No Depression: Yes Mania: No Psychosis: Yes Schizophrenia: Yes Personality Disorder: No Hospitalization for psychiatric illness: Yes History of Electroconvulsive Shock Therapy: No Prior Suicide Attempts: Yes  Physical Exam: Constitutional:  BP 122/84 mmHg  Pulse 93  Ht 5\' 9"  (1.753 m)  Wt 226 lb 12.8 oz (102.876 kg)  BMI  33.48 kg/m2  General Appearance: alert, oriented, no acute distress and well nourished  Musculoskeletal: Strength & Muscle Tone: within normal limits Gait & Station: normal Patient leans: N/A  Mental Status Examination/Evaluation: Objective: Attitude: Calm and cooperative  Appearance: Fairly Groomed, appears to be stated age  Patent attorneyye Contact::  Fair  Speech:  Clear and Coherent and Normal Rate  Volume:  Normal  Mood:  euthymic  Affect:  Full Range  Thought Process:  Linear and Logical  Orientation:  Full (Time, Place, and Person)  Thought Content:  Negative  Suicidal Thoughts:  No  Homicidal Thoughts:  No  Judgement:  Fair  Insight:  Fair  Concentration: good  Memory: Immediate-fair Recent-fair Remote-fair  Recall: fair  Language: fair  Gait and Station: normal  [employer] of Knowledge: average  Psychomotor Activity:  Normal  Akathisia:  No  Handed:  Right  AIMS (if indicated):  Facial and Oral Movements  Muscles of Facial Expression: None, normal  Lips and Perioral Area: None, normal  Jaw: None, normal  Tongue: None, normal Extremity Movements: Upper (arms, wrists, hands, fingers): None, normal  Lower (legs, knees, ankles, toes): None, normal,  Trunk Movements:  Neck, shoulders, hips: None, normal,  Overall Severity : Severity of abnormal movements (highest score from questions above): None, normal  Incapacitation due to abnormal movements: None, normal  Patient's awareness of abnormal movements (rate only patient's report): No Awareness, Dental Status  Current problems with teeth and/or dentures?: No  Does patient usually wear dentures?: No    Assets:  Communication Skills Desire for Improvement Financial Resources/Insurance Housing Intimacy Leisure Time Physical Health Resilience Social Support Talents/Skills       Medical Decision Making (Choose Three): Established Problem, Stable/Improving (1), Review of Psycho-Social Stressors (1), Review or  order clinical lab tests (1) and Review of Medication Regimen & Side Effects (2)  Assessment: Axis I: Schizophrenia, paranoid type; MDD- recurrent, severe with psychosis- in remission  Axis II: Deferred   Plan: Continue Zyprexa 5 MG PO qHS for depression and mood stabilization  Medication management with supportive therapy. Risks/benefits and SE of the medication discussed. Pt verbalized understanding and verbal consent obtained for treatment.  Affirm with the patient that the medications are taken as ordered. Patient expressed understanding of how their medications were to be used.   Labs: reviewed CBC WNL, CMP WNL, HbA1c WNL, Lipid panel Trig 146, TSH WNL, Prolactin level WNL  EKG reviewed done on 03/05/2015 Sinus bradycardia, QTc 392 -Will order labs at next visit.   Therapy: brief supportive therapy provided. Discussed psychosocial stressors in detail.   Recommend diet and exercise for elevated triglycerides and weight gain  Pt denies SI and is at an acute low risk for suicide.Patient told to call clinic if any problems occur. Patient advised to go to ER if they should develop SI/HI, side effects, or if symptoms worsen. Has crisis numbers to call if needed. Pt verbalized understanding.  F/up in 3 months or sooner if needed   Oletta DarterAGARWAL, Makensey Rego, MD 10/07/2015

## 2016-01-06 ENCOUNTER — Ambulatory Visit (INDEPENDENT_AMBULATORY_CARE_PROVIDER_SITE_OTHER): Payer: Federal, State, Local not specified - PPO | Admitting: Psychiatry

## 2016-01-06 ENCOUNTER — Encounter (HOSPITAL_COMMUNITY): Payer: Self-pay | Admitting: Psychiatry

## 2016-01-06 VITALS — BP 122/78 | HR 71 | Ht 69.0 in | Wt 234.4 lb

## 2016-01-06 DIAGNOSIS — F200 Paranoid schizophrenia: Secondary | ICD-10-CM

## 2016-01-06 DIAGNOSIS — F339 Major depressive disorder, recurrent, unspecified: Secondary | ICD-10-CM | POA: Diagnosis not present

## 2016-01-06 MED ORDER — OLANZAPINE 5 MG PO TABS: ORAL_TABLET | ORAL | Status: DC

## 2016-01-06 NOTE — Progress Notes (Signed)
Patient ID: Michael Vega, male   DOB: 01-19-1996, 20 y.o.   MRN: [id number] [facility] [id number] Progress Note  Michael Vega [id number] 19 y.o.  01/06/2016 10:25 AM  Chief Complaint: "things are going good "  History of Present Illness: Pt is a 20 yr old male with h/o MDD with psychosis and Schizophrenia paranoid type who presents today for a followup visit.  Patient reports that he is doing well. 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 Pt is going to take a summer class so he can transfer to [employer] in the fall. Things at home are going well.   He's also been taking his Zyprexa  as prescribed and denies SE. States it has helped with mood and battling the Schizophrenia and he likes it except for the weight gain. He reports some motivation to exercise and is trying to find a good place to work out.   States he is happy. Pt denies depression, sad mood, isolation, anhedonia, worthlessness, hopelessness and crying spells.  He denies any anxiety,  paranoia, ideas of reference, hallucinations at this visit.   Sleep is good and is practicing good sleep hygeine. Pt is sleeping about 8-10 hrs/night. Energy is good. Appetite is ok but he never feels full. He usually eats 2 meals a day and he eats 3-4 snacks per day. Concentration  good. No longer drinking sodas.   Suicidal Ideation: No Plan Formed: No Patient has means to carry out plan: No  Homicidal Ideation: No Plan Formed: No Patient has means to carry out plan: No  Review of Systems: Psychiatric: Agitation: No Hallucination: No Depressed Mood: No Insomnia: No Hypersomnia: No Altered Concentration: No Feels Worthless: No Grandiose Ideas: No Belief In Special Powers: No New/Increased Substance Abuse: No Compulsions: No    Neurologic: Headache: No  Seizure: No Paresthesias: No Review of Systems  Constitutional: Negative for fever, chills,  weight loss and malaise/fatigue.  HENT: Negative for congestion, nosebleeds and sore throat.   Eyes: Negative for blurred vision, double vision and redness.  Respiratory: Negative for cough, shortness of breath and wheezing.   Cardiovascular: Negative for chest pain, palpitations and leg swelling.  Gastrointestinal: Negative for heartburn, nausea, vomiting and abdominal pain.  Musculoskeletal: Negative for back pain, joint pain and neck pain.  Skin: Negative for itching and rash.  Neurological: Negative for dizziness, focal weakness, seizures, loss of consciousness and headaches.  Psychiatric/Behavioral: Negative for depression, suicidal ideas, hallucinations and substance abuse. The patient is not nervous/anxious and does not have insomnia.       Past Medical Family, Social History: lives with his mom, grandmother, and Little sister.  reports that he has never smoked. He has never used smokeless tobacco. He reports that he does not drink alcohol or use illicit drugs. Working at [employer].   Family History  Problem Relation Age of Onset  . ADD / ADHD Neg Hx   . Alcohol abuse Neg Hx   . Anxiety disorder Neg Hx   . Bipolar disorder Neg Hx   . Depression Neg Hx   . Schizophrenia Neg Hx    Past Medical History  Diagnosis Date  . Schizophrenia (HCC)   . Depression   . Schizophrenia, paranoid [facility])      Outpatient Encounter Prescriptions as of 01/06/2016  Medication Sig  . OLANZapine (ZYPREXA) 5 MG tablet TAKE 1 TABLET (5 MG TOTAL) BY MOUTH  AT BEDTIME.  . [DISCONTINUED] azithromycin (ZITHROMAX) 250 MG tablet Take 2 tablets PO on day one, and one tablet PO daily thereafter until completed. (Patient not taking: Reported on 10/07/2015)   No facility-administered encounter medications on file as of 01/06/2016.    Past Psychiatric History/Hospitalization(s): Anxiety: No Bipolar Disorder: No Depression: Yes Mania: No Psychosis: Yes Schizophrenia: Yes Personality Disorder:  No Hospitalization for psychiatric illness: Yes History of Electroconvulsive Shock Therapy: No Prior Suicide Attempts: Yes  Physical Exam: Constitutional:  BP 122/78 mmHg  Pulse 71  Ht  (1.753 m)  Wt 234 lb 6.4 oz (106.323 kg)  BMI 34.60 kg/m2  General Appearance: alert, oriented, no acute distress and well nourished  Musculoskeletal: Strength & Muscle Tone: within normal limits Gait & Station: normal Patient leans: straight  Mental Status Examination/Evaluation: Objective: Attitude: Calm and cooperative  Appearance: Fairly Groomed, appears to be stated age  Patent attorney::  Fair  Speech:  Clear and Coherent and Normal Rate  Volume:  Normal  Mood:  euthymic  Affect:  Full Range  Thought Process:  Linear and Logical  Orientation:  Full (Time, Place, and Person)  Thought Content:  Negative  Suicidal Thoughts:  No  Homicidal Thoughts:  No  Judgement:  Fair  Insight:  Fair  Concentration: good  Memory: Immediate-fair Recent-fair Remote-fair  Recall: fair  Language: fair  Gait and Station: normal  [employer] of Knowledge: average  Psychomotor Activity:  Normal  Akathisia:  No  Handed:  Right  AIMS (if indicated):  Facial and Oral Movements  Muscles of Facial Expression: None, normal  Lips and Perioral Area: None, normal  Jaw: None, normal  Tongue: None, normal Extremity Movements: Upper (arms, wrists, hands, fingers): None, normal  Lower (legs, knees, ankles, toes): None, normal,  Trunk Movements:  Neck, shoulders, hips: None, normal,  Overall Severity : Severity of abnormal movements (highest score from questions above): None, normal  Incapacitation due to abnormal movements: None, normal  Patient's awareness of abnormal movements (rate only patient's report): No Awareness, Dental Status  Current problems with teeth and/or dentures?: No  Does patient usually wear dentures?: No    Assets:  Communication Skills Desire for Improvement Financial  Resources/Insurance Housing Intimacy Leisure Time Physical Health Resilience Social Support Talents/Skills       Medical Decision Making (Choose Three): Established Problem, Stable/Improving (1), Review of Psycho-Social Stressors (1) and Review of Medication Regimen & Side Effects (2)  Assessment: Axis I: Schizophrenia, paranoid type; MDD- recurrent, severe with psychosis- in remission  Axis II: Deferred   Plan: Continue Zyprexa 5 MG PO qHS for depression and mood stabilization  Medication management with supportive therapy. Risks/benefits and SE of the medication discussed. Pt verbalized understanding and verbal consent obtained for treatment.  Affirm with the patient that the medications are taken as ordered. Patient expressed understanding of how their medications were to be used.   Labs: reviewed CBC WNL, CMP WNL, HbA1c WNL, Lipid panel Trig 146, TSH WNL, Prolactin level WNL  EKG reviewed done on 03/05/2015 Sinus bradycardia, QTc 392 -Will order labs at next visit.   Therapy: brief supportive therapy provided. Discussed psychosocial stressors in detail.   Recommend diet and exercise for elevated triglycerides and weight gain  Pt denies SI and is at an acute low risk for suicide.Patient told to call clinic if any problems occur. Patient advised to go to ER if they should develop SI/HI, side effects, or if symptoms worsen. Has crisis numbers to call if needed. Pt  verbalized understanding.  F/up in 4 months or sooner if needed   Oletta DarterSalina Jeslynn Hollander, MD 01/06/2016

## 2016-01-17 ENCOUNTER — Other Ambulatory Visit (HOSPITAL_COMMUNITY): Payer: Self-pay | Admitting: Psychiatry

## 2016-05-09 ENCOUNTER — Ambulatory Visit (HOSPITAL_COMMUNITY): Payer: Self-pay | Admitting: Psychiatry

## 2016-05-11 ENCOUNTER — Encounter (HOSPITAL_COMMUNITY): Payer: Self-pay | Admitting: Psychiatry

## 2016-05-11 ENCOUNTER — Ambulatory Visit (INDEPENDENT_AMBULATORY_CARE_PROVIDER_SITE_OTHER): Payer: Federal, State, Local not specified - PPO | Admitting: Psychiatry

## 2016-05-11 VITALS — BP 124/78 | HR 91 | Ht 69.0 in | Wt 230.8 lb

## 2016-05-11 DIAGNOSIS — F339 Major depressive disorder, recurrent, unspecified: Secondary | ICD-10-CM

## 2016-05-11 DIAGNOSIS — Z79899 Other long term (current) drug therapy: Secondary | ICD-10-CM | POA: Diagnosis not present

## 2016-05-11 DIAGNOSIS — F200 Paranoid schizophrenia: Secondary | ICD-10-CM | POA: Diagnosis not present

## 2016-05-11 MED ORDER — OLANZAPINE 5 MG PO TABS: ORAL_TABLET | ORAL | 3 refills | Status: DC

## 2016-05-11 NOTE — Patient Instructions (Signed)
CBC, CMP, HbA1c, Lipid panel, TSH, Prolactin level, EKG   Call [phone] [facility] [place]

## 2016-05-11 NOTE — Progress Notes (Signed)
Patient ID: Michael Vega, male   DOB: 06/07/1996, 20 y.o.   MRN: [id number] [facility] [id number] Progress Note  Michael Vega [id number] 20 y.o.  05/11/2016 4:02 PM  Chief Complaint: "things are going good "  History of Present Illness: Pt is a 20 yr old male with h/o MDD with psychosis and Schizophrenia paranoid type who presents today for a followup visit. No concerns today.   Patient reports that he is doing well. Pt is working part time for [employer]. He is a Animatorfield supervisor and likes it. He wants to find a full time job. Pt is going to [employer] this semester. He needs one more class to transfer to [employer]. Things at home are going well.   He's also been taking his Zyprexa 5mg  as prescribed and denies SE. States it has helped with mood and battling the Schizophrenia and he likes it except for the weight gain. Denies paranoia and AVH.   States he is happy. Pt denies depression, sad mood, isolation, anhedonia, worthlessness, hopelessness and crying spells.  He denies any anxiety,  paranoia, ideas of reference, hallucinations at this visit.   Sleep is good and is practicing good sleep hygeine. Pt is sleeping about 8-10 hrs/night. Energy is good and he has been working out several times a week. Appetite is ok but he never feels full. He usually eats 2 meals a day and he eats 3-4 snacks per day. He has changed his diet to healthier snacks. Concentration is good. No longer drinking sodas.   Suicidal Ideation: No Plan Formed: No Patient has means to carry out plan: No  Homicidal Ideation: No Plan Formed: No Patient has means to carry out plan: No  Review of Systems: Psychiatric: Agitation: No Hallucination: No Depressed Mood: No Insomnia: No Hypersomnia: No Altered Concentration: No Feels Worthless: No Grandiose Ideas: No Belief In Special Powers: No New/Increased Substance Abuse: No Compulsions: No    Neurologic: Headache: No  Seizure: No Paresthesias: No Review  of Systems  Constitutional: Negative for chills, fever, malaise/fatigue and weight loss.  HENT: Negative for congestion, nosebleeds and sore throat.   Eyes: Negative for blurred vision, double vision and redness.  Respiratory: Negative for cough, shortness of breath and wheezing.   Cardiovascular: Negative for chest pain, palpitations and leg swelling.  Gastrointestinal: Negative for abdominal pain, heartburn, nausea and vomiting.  Musculoskeletal: Negative for back pain, joint pain and neck pain.  Skin: Negative for itching and rash.  Neurological: Negative for dizziness, focal weakness, seizures, loss of consciousness and headaches.  Psychiatric/Behavioral: Negative for depression, hallucinations, substance abuse and suicidal ideas. The patient is not nervous/anxious and does not have insomnia.       Past Medical Family, Social History: lives with his mom, grandmother, and Little sister.  reports that he has never smoked. He has never used smokeless tobacco. He reports that he does not drink alcohol or use drugs. Working at [employer].   Family History  Problem Relation Age of Onset  . ADD / ADHD Neg Hx   . Alcohol abuse Neg Hx   . Anxiety disorder Neg Hx   . Bipolar disorder Neg Hx   . Depression Neg Hx   . Schizophrenia Neg Hx    Past Medical History:  Diagnosis Date  . Depression   . Schizophrenia (HCC)   . Schizophrenia, paranoid [facility](HCC)      Outpatient Encounter Prescriptions as of 05/11/2016  Medication Sig Dispense Refill  . OLANZapine (ZYPREXA) 5 MG  tablet TAKE 1 TABLET (5 MG TOTAL) BY MOUTH AT BEDTIME. 30 tablet 3   No facility-administered encounter medications on file as of 05/11/2016.     Past Psychiatric History/Hospitalization(s): Anxiety: No Bipolar Disorder: No Depression: Yes Mania: No Psychosis: Yes Schizophrenia: Yes Personality Disorder: No Hospitalization for psychiatric illness: Yes History of Electroconvulsive Shock Therapy: No Prior Suicide  Attempts: Yes  Physical Exam: Constitutional:  BP 124/78   Pulse 91   Ht 5\' 9"  (1.753 m)   Wt 230 lb 12.8 oz (104.7 kg)   BMI 34.08 kg/m   General Appearance: alert, oriented, no acute distress and well nourished  Musculoskeletal: Strength & Muscle Tone: within normal limits Gait & Station: normal Patient leans: straight  Mental Status Examination/Evaluation: Objective: Attitude: Calm and cooperative  Appearance: Fairly Groomed, appears to be stated age  Patent attorneyye Contact::  Fair  Speech:  Clear and Coherent and Normal Rate  Volume:  Normal  Mood:  euthymic  Affect:  Full Range  Thought Process:  Linear and Logical  Orientation:  Full (Time, Place, and Person)  Thought Content:  Negative  Suicidal Thoughts:  No  Homicidal Thoughts:  No  Judgement:  Fair  Insight:  Fair  Concentration: good  Memory: Immediate-fair Recent-fair Remote-fair  Recall: fair  Language: fair  Gait and Station: normal  [employer] of Knowledge: average  Psychomotor Activity:  Normal  Akathisia:  No  Handed:  Right  AIMS (if indicated):  Facial and Oral Movements  Muscles of Facial Expression: None, normal  Lips and Perioral Area: None, normal  Jaw: None, normal  Tongue: None, normal Extremity Movements: Upper (arms, wrists, hands, fingers): None, normal  Lower (legs, knees, ankles, toes): None, normal,  Trunk Movements:  Neck, shoulders, hips: None, normal,  Overall Severity : Severity of abnormal movements (highest score from questions above): None, normal  Incapacitation due to abnormal movements: None, normal  Patient's awareness of abnormal movements (rate only patient's report): No Awareness, Dental Status  Current problems with teeth and/or dentures?: No  Does patient usually wear dentures?: No    Assets:  Communication Skills Desire for Improvement Financial Resources/Insurance Housing Intimacy Leisure Time Physical Health Resilience Social Support Talents/Skills        Assessment: Axis I: Schizophrenia, paranoid type; MDD- recurrent, severe with psychosis- in remission  Axis II: Deferred   Plan: Continue Zyprexa 5 MG PO qHS for depression and mood stabilization  Medication management with supportive therapy. Risks/benefits and SE of the medication discussed. Pt verbalized understanding and verbal consent obtained for treatment.  Affirm with the patient that the medications are taken as ordered. Patient expressed understanding of how their medications were to be used.   Labs: order CBC, CMP, HbA1c, Lipid panel, TSH, Prolactin level, EKG   Therapy: brief supportive therapy provided. Discussed psychosocial stressors in detail.   Recommend diet and exercise for elevated triglycerides and weight gain  Pt denies SI and is at an acute low risk for suicide.Patient told to call clinic if any problems occur. Patient advised to go to ER if they should develop SI/HI, side effects, or if symptoms worsen. Has crisis numbers to call if needed. Pt verbalized understanding.  F/up in 4 months or sooner if needed   Oletta DarterSalina Ruairi Stutsman, MD 05/11/2016

## 2016-05-30 ENCOUNTER — Encounter: Payer: Self-pay | Admitting: Family Medicine

## 2016-05-30 ENCOUNTER — Ambulatory Visit (INDEPENDENT_AMBULATORY_CARE_PROVIDER_SITE_OTHER): Payer: Federal, State, Local not specified - PPO | Admitting: Family Medicine

## 2016-05-30 VITALS — BP 112/78 | HR 66 | Temp 98.2°F | Resp 16 | Ht 69.0 in | Wt 233.0 lb

## 2016-05-30 DIAGNOSIS — Z0000 Encounter for general adult medical examination without abnormal findings: Secondary | ICD-10-CM | POA: Diagnosis not present

## 2016-05-30 LAB — EKG 12-LEAD: EKG 12 LEAD: NORMAL

## 2016-05-30 NOTE — Progress Notes (Signed)
Subjective:     Patient ID: Michael Vega, male   DOB: 06/11/1996, 20 y.o.   MRN: [id number]  HPI  Chief Complaint  Patient presents with  . Annual Exam    Patient is present for his annual physical, he states he has no concerns or questions today. He is due today for flu vaccine but has declined.  Continues to work for [employer]. Followed by  Psychiatry and has full panel of labs pending. Wishes us to perform the EKG. Reports [facility] a treadmill [facility] [employer] [facility] [employer] [facility] x week.  Review of Systems General: Feeling well HEENT: regular dental visits and eye exams. Cardiovascular: no chest pain, shortness of breath, or palpitations GI: rare heartburn, no change in bowel habits  GU: no change in bladder habits  Psychiatric: stable/followed by psychiatry Musculoskeletal: reports dorsum of his foot gets sore with exercise and w.b. Hx of pes planus.    Objective:   Physical Exam  Constitutional: He appears well-developed and well-nourished. No distress.  Eyes: PERRLA, Neck: no thyromegaly, tenderness or nodules, no cervical adenopathy ENT: TM's intact without inflammation; No tonsillar enlargement or exudate. Wearing braces Chest: Large breasts but feel primarily fat with little breast tissue. Lungs: Clear Heart : RRR without murmur or gallop Abd: bowel sounds present, soft, non-tender, no organomegaly Extremities: no edema, left pedal pulses intact, no tenderness on palpation, ankle and foot ligaments intact to testing, DF/PF 5/5     Assessment:    1. Annual physical exam: labs pending per psychiatry order - EKG 12-Lead    Plan:    Discussed use of gel inserts. If foot not improving to call for podiatry referral. Further f/u pending lab review.

## 2016-05-30 NOTE — Patient Instructions (Addendum)
Try gel shoe inserts when exercising. If this does not help your foot pain then call for podiatry referral. Weight: 04/2016   230 09/2015    226 02/2015   221 04/2011   181

## 2016-09-14 ENCOUNTER — Ambulatory Visit (HOSPITAL_COMMUNITY): Payer: Self-pay | Admitting: Psychiatry

## 2016-10-28 ENCOUNTER — Other Ambulatory Visit (HOSPITAL_COMMUNITY): Payer: Self-pay | Admitting: Psychiatry

## 2016-10-28 DIAGNOSIS — F339 Major depressive disorder, recurrent, unspecified: Secondary | ICD-10-CM

## 2016-10-28 DIAGNOSIS — F200 Paranoid schizophrenia: Secondary | ICD-10-CM

## 2016-11-02 ENCOUNTER — Ambulatory Visit (HOSPITAL_COMMUNITY): Payer: Self-pay | Admitting: Psychiatry

## 2016-12-21 ENCOUNTER — Ambulatory Visit (INDEPENDENT_AMBULATORY_CARE_PROVIDER_SITE_OTHER): Payer: Federal, State, Local not specified - PPO | Admitting: Psychiatry

## 2016-12-21 ENCOUNTER — Encounter (HOSPITAL_COMMUNITY): Payer: Self-pay | Admitting: Psychiatry

## 2016-12-21 VITALS — BP 122/78 | HR 97 | Ht 69.0 in | Wt 238.0 lb

## 2016-12-21 DIAGNOSIS — F200 Paranoid schizophrenia: Secondary | ICD-10-CM

## 2016-12-21 DIAGNOSIS — F5101 Primary insomnia: Secondary | ICD-10-CM

## 2016-12-21 DIAGNOSIS — Z79899 Other long term (current) drug therapy: Secondary | ICD-10-CM

## 2016-12-21 DIAGNOSIS — F339 Major depressive disorder, recurrent, unspecified: Secondary | ICD-10-CM

## 2016-12-21 MED ORDER — TRAZODONE HCL 50 MG PO TABS
50.0000 mg | ORAL_TABLET | Freq: Every evening | ORAL | 2 refills | Status: DC | PRN
Start: 2016-12-21 — End: 2017-07-12

## 2016-12-21 MED ORDER — OLANZAPINE 5 MG PO TABS: ORAL_TABLET | ORAL | 3 refills | Status: DC

## 2016-12-21 NOTE — Progress Notes (Signed)
BH MD/PA/NP OP Progress Note  12/21/2016 3:02 PM Zollie ScaleMichael D Kneip  MRN:  [id number]  Chief Complaint:  Chief Complaint    Follow-up      HPI: Pt states he is doing well.   He still has issues falling asleep. It takes him at least one hour to fall asleep. He is very frustrated by this. Pt puts up all electronics 1 hr before trying to lie down to sleep.  Once he falls asleep he will sleep continuously for 8-9hrs/night. Energy is good.   Pt denies depression and anxiety. Denies anhedonia, isolation, crying spells, low motivation, poor hygiene, worthlessness and hopelessness. Denies SI/HI.  Pt denies AVH, paranoia and ideas of reference.  Taking meds as prescribed and denies SE.       Visit Diagnosis:    ICD-9-CM ICD-10-CM   1. 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 Paranoid schizophrenia (HCC) 295.30 F20.0 OLANZapine (ZYPREXA) 5 MG tablet  3. Recurrent major depressive disorder, remission status unspecified (HCC) 296.30 F33.9 OLANZapine (ZYPREXA) 5 MG tablet    Past Psychiatric History: see H&P  Past Medical History:  Past Medical History:  Diagnosis Date  . Depression   . Schizophrenia (HCC)   . Schizophrenia, paranoid [facility](HCC)     Past Surgical History:  Procedure Laterality Date  . WISDOM TOOTH EXTRACTION      Family Psychiatric History:  Family History  Problem Relation Age of Onset  . ADD / ADHD Neg Hx   . Alcohol abuse Neg Hx   . Anxiety disorder Neg Hx   . Bipolar disorder Neg Hx   . Depression Neg Hx   . Schizophrenia Neg Hx     Social History:  Social History   Social History  . Marital status: Single    Spouse name: N/A  . Number of children: N/A  . Years of education: N/A   Social History Main Topics  . Smoking status: Never Smoker  . Smokeless tobacco: Never Used  . Alcohol use No  . Drug use: No  . Sexual activity: Not Currently   Other Topics Concern  . None   Social History Narrative  . None     Allergies: No Known Allergies  Metabolic Disorder Labs: Lab Results  Component Value Date   HGBA1C  12/11/2010    5.4 (NOTE)                                                                       According to the ADA Clinical Practice Recommendations for 2011, when HbA1c is used as a screening test:   >=6.5%   Diagnostic of Diabetes Mellitus           (if abnormal result  is confirmed)  5.7-6.4%   Increased risk of developing Diabetes Mellitus  References:Diagnosis and Classification of Diabetes Mellitus,Diabetes Care,2011,34(Suppl 1):S62-S69 and Standards of Medical Care in         Diabetes - 2011,Diabetes Care,2011,34  (Suppl 1):S11-S61.   MPG 108 12/11/2010   No results found for: PROLACTIN Lab Results  Component Value Date   CHOL  12/11/2010    162        ATP III CLASSIFICATION:  <200     mg/dL  Desirable  200-239  mg/dL   Borderline High  >=130    mg/dL   High          TRIG 80 12/11/2010   HDL 42 12/11/2010   CHOLHDL 3.9 12/11/2010   VLDL 16 12/11/2010   LDLCALC  12/11/2010    104        Total Cholesterol/HDL:CHD Risk Coronary Heart Disease Risk Table                     Men   Women  1/2 Average Risk   3.4   3.3  Average Risk       5.0   4.4  2 X Average Risk   9.6   7.1  3 X Average Risk  23.4   11.0        Use the calculated Patient Ratio above and the CHD Risk Table to determine the patient's CHD Risk.        ATP III CLASSIFICATION (LDL):  <100     mg/dL   Optimal  865-784  mg/dL   Near or Above                    Optimal  130-159  mg/dL   Borderline  696-295  mg/dL   High  >284     mg/dL   Very High     Current Medications: Current Outpatient Prescriptions  Medication Sig Dispense Refill  . OLANZapine (ZYPREXA) 5 MG tablet TAKE 1 TABLET (5 MG TOTAL) BY MOUTH AT BEDTIME. 30 tablet 1   No current facility-administered medications for this visit.       Musculoskeletal: Strength & Muscle Tone: within normal limits Gait & Station: normal Patient  leans: N/A  Psychiatric Specialty Exam: Review of Systems  Gastrointestinal: Positive for heartburn. Negative for abdominal pain, diarrhea, nausea and vomiting.  Neurological: Positive for headaches. Negative for dizziness, tremors, sensory change, seizures and loss of consciousness.  Psychiatric/Behavioral: Negative for depression, hallucinations, substance abuse and suicidal ideas. The patient has insomnia. The patient is not nervous/anxious.     Blood pressure 122/78, pulse 97, height 5\' 9"  (1.753 m), weight 238 lb (108 kg).Body mass index is 35.15 kg/m.  General Appearance: Fairly Groomed  Eye Contact:  Good  Speech:  Clear and Coherent and Normal Rate  Volume:  Normal  Mood:  Euthymic  Affect:  Appropriate and Full Range  Thought Process:  Goal Directed and Descriptions of Associations: Intact  Orientation:  Full (Time, Place, and Person)  Thought Content: WDL   Suicidal Thoughts:  No  Homicidal Thoughts:  No  Memory:  Immediate;   Good Recent;   Good Remote;   Good  Judgement:  Good  Insight:  Good  Psychomotor Activity:  Normal  Concentration:  Concentration: Good and Attention Span: Good  Recall:  Good  Fund of Knowledge: Good  Language: Good  Akathisia:  No  Handed:  Right  AIMS (if indicated):  AIMS:  Facial and Oral Movements  Muscles of Facial Expression: None, normal  Lips and Perioral Area: None, normal  Jaw: None, normal  Tongue: None, normal Extremity Movements: Upper (arms, wrists, hands, fingers): None, normal  Lower (legs, knees, ankles, toes): None, normal,  Trunk Movements:  Neck, shoulders, hips: None, normal,  Overall Severity : Severity of abnormal movements (highest score from questions above): None, normal  Incapacitation due to abnormal movements: None, normal  Patient's awareness of abnormal movements (rate only patient's report): No Awareness,  Dental Status  Current problems with teeth and/or dentures?: No  Does patient usually wear  dentures?: No     Assets:  Communication Skills Desire for Improvement  ADL's:  Intact  Cognition: WNL  Sleep:  poor     Treatment Plan Summary:Medication management and Plan see below  Assessment:  Schizophrenia- paranoid; MDD- recurrent- resolved   Medication management with supportive therapy. Risks/benefits and SE of the medication discussed. Pt verbalized understanding and verbal consent obtained for treatment.  Affirm with the patient that the medications are taken as ordered. Patient expressed understanding of how their medications were to be used.     Meds: Zyprexa 5mg  po qHS for schizophrenia Start trial of Trazodone 50mg  po qHS prn insomnia   Labs: EKG 05/30/2016 QTc 357  Given lab slips forCBC, CMP, HbA1c, Lipid panel, TSH, Prolactin level     Therapy: brief supportive therapy provided. Discussed psychosocial stressors in detail.   Reviewed sleep hygiene in detail   Consultations:  None  Pt denies SI and is at an acute low risk for suicide. Patient told to call clinic if any problems occur. Patient advised to go to ER if they should develop SI/HI, side effects, or if symptoms worsen. Has crisis numbers to call if needed. Pt verbalized understanding.  F/up in 3 months or sooner if needed  Oletta Darter, MD 12/21/2016, 3:02 PM

## 2017-01-01 ENCOUNTER — Other Ambulatory Visit (HOSPITAL_COMMUNITY): Payer: Self-pay | Admitting: Psychiatry

## 2017-01-01 DIAGNOSIS — F200 Paranoid schizophrenia: Secondary | ICD-10-CM

## 2017-01-01 DIAGNOSIS — F339 Major depressive disorder, recurrent, unspecified: Secondary | ICD-10-CM

## 2017-01-08 ENCOUNTER — Other Ambulatory Visit (HOSPITAL_COMMUNITY): Payer: Self-pay

## 2017-01-08 DIAGNOSIS — F339 Major depressive disorder, recurrent, unspecified: Secondary | ICD-10-CM

## 2017-01-08 DIAGNOSIS — F200 Paranoid schizophrenia: Secondary | ICD-10-CM

## 2017-01-08 MED ORDER — OLANZAPINE 5 MG PO TABS: ORAL_TABLET | ORAL | 0 refills | Status: DC

## 2017-01-08 NOTE — Progress Notes (Signed)
Patients pharmacy sent fax, patients insurance will not cover a 30 day supply of the Olanzapine, prescription had 3 refills on it so I sent in 90 tabs with no refill to the pharmacy

## 2017-01-26 ENCOUNTER — Other Ambulatory Visit (HOSPITAL_COMMUNITY): Payer: Self-pay | Admitting: Psychiatry

## 2017-01-26 DIAGNOSIS — Z79899 Other long term (current) drug therapy: Secondary | ICD-10-CM | POA: Diagnosis not present

## 2017-01-27 LAB — CBC WITH DIFFERENTIAL/PLATELET
BASOS: 1 %
Basophils Absolute: 0 10*3/uL (ref 0.0–0.2)
EOS (ABSOLUTE): 0.4 10*3/uL (ref 0.0–0.4)
EOS: 7 %
HEMATOCRIT: 47.2 % (ref 37.5–51.0)
Hemoglobin: 16.2 g/dL (ref 13.0–17.7)
IMMATURE GRANS (ABS): 0 10*3/uL (ref 0.0–0.1)
IMMATURE GRANULOCYTES: 0 %
Lymphocytes Absolute: 2.1 10*3/uL (ref 0.7–3.1)
Lymphs: 39 %
MCH: 28.6 pg (ref 26.6–33.0)
MCHC: 34.3 g/dL (ref 31.5–35.7)
MCV: 83 fL (ref 79–97)
Monocytes Absolute: 0.3 10*3/uL (ref 0.1–0.9)
Monocytes: 6 %
NEUTROS ABS: 2.6 10*3/uL (ref 1.4–7.0)
Neutrophils: 47 %
PLATELETS: 298 10*3/uL (ref 150–379)
RBC: 5.66 x10E6/uL (ref 4.14–5.80)
RDW: 13.5 % (ref 12.3–15.4)
WBC: 5.4 10*3/uL (ref 3.4–10.8)

## 2017-01-27 LAB — COMPREHENSIVE METABOLIC PANEL
A/G RATIO: 1.8 (ref 1.2–2.2)
ALT: 17 IU/L (ref 0–44)
AST: 20 IU/L (ref 0–40)
Albumin: 4.6 g/dL (ref 3.5–5.5)
Alkaline Phosphatase: 69 IU/L (ref 39–117)
BUN/Creatinine Ratio: 12 (ref 9–20)
BUN: 12 mg/dL (ref 6–20)
Bilirubin Total: 0.4 mg/dL (ref 0.0–1.2)
CALCIUM: 9.5 mg/dL (ref 8.7–10.2)
CO2: 24 mmol/L (ref 18–29)
CREATININE: 1.04 mg/dL (ref 0.76–1.27)
Chloride: 100 mmol/L (ref 96–106)
GFR, EST AFRICAN AMERICAN: 118 mL/min/{1.73_m2} (ref >59)
GFR, EST NON AFRICAN AMERICAN: 102 mL/min/{1.73_m2} (ref >59)
GLOBULIN, TOTAL: 2.5 g/dL (ref 1.5–4.5)
Glucose: 80 mg/dL (ref 65–99)
POTASSIUM: 4.5 mmol/L (ref 3.5–5.2)
Sodium: 141 mmol/L (ref 134–144)
TOTAL PROTEIN: 7.1 g/dL (ref 6.0–8.5)

## 2017-01-27 LAB — LIPID PANEL W/O CHOL/HDL RATIO
Cholesterol, Total: 192 mg/dL (ref 100–199)
HDL: 37 mg/dL — AB (ref >39)
LDL CALC: 126 mg/dL — AB (ref 0–99)
Triglycerides: 146 mg/dL (ref 0–149)
VLDL Cholesterol Cal: 29 mg/dL (ref 5–40)

## 2017-01-27 LAB — TSH: TSH: 1.2 u[IU]/mL (ref 0.450–4.500)

## 2017-01-27 LAB — HGB A1C W/O EAG: HEMOGLOBIN A1C: 5.3 % (ref 4.8–5.6)

## 2017-01-27 LAB — PROLACTIN: Prolactin: 25.4 ng/mL — ABNORMAL HIGH (ref 4.0–15.2)

## 2017-03-22 ENCOUNTER — Ambulatory Visit (HOSPITAL_COMMUNITY): Payer: Self-pay | Admitting: Psychiatry

## 2017-05-17 ENCOUNTER — Other Ambulatory Visit (HOSPITAL_COMMUNITY): Payer: Self-pay | Admitting: Psychiatry

## 2017-05-17 DIAGNOSIS — F200 Paranoid schizophrenia: Secondary | ICD-10-CM

## 2017-05-17 DIAGNOSIS — F339 Major depressive disorder, recurrent, unspecified: Secondary | ICD-10-CM

## 2017-05-21 DIAGNOSIS — F4323 Adjustment disorder with mixed anxiety and depressed mood: Secondary | ICD-10-CM | POA: Diagnosis not present

## 2017-05-23 DIAGNOSIS — K080 Exfoliation of teeth due to systemic causes: Secondary | ICD-10-CM | POA: Diagnosis not present

## 2017-05-24 ENCOUNTER — Ambulatory Visit (HOSPITAL_COMMUNITY): Payer: Self-pay | Admitting: Psychiatry

## 2017-05-30 DIAGNOSIS — F4323 Adjustment disorder with mixed anxiety and depressed mood: Secondary | ICD-10-CM | POA: Diagnosis not present

## 2017-06-04 ENCOUNTER — Other Ambulatory Visit (HOSPITAL_COMMUNITY): Payer: Self-pay

## 2017-06-04 DIAGNOSIS — F339 Major depressive disorder, recurrent, unspecified: Secondary | ICD-10-CM

## 2017-06-04 DIAGNOSIS — F200 Paranoid schizophrenia: Secondary | ICD-10-CM

## 2017-06-04 MED ORDER — OLANZAPINE 5 MG PO TABS: 5.0000 mg | ORAL_TABLET | Freq: Every day | ORAL | 0 refills | Status: DC

## 2017-06-30 ENCOUNTER — Other Ambulatory Visit (HOSPITAL_COMMUNITY): Payer: Self-pay | Admitting: Psychiatry

## 2017-06-30 DIAGNOSIS — F200 Paranoid schizophrenia: Secondary | ICD-10-CM

## 2017-06-30 DIAGNOSIS — F339 Major depressive disorder, recurrent, unspecified: Secondary | ICD-10-CM

## 2017-07-12 ENCOUNTER — Encounter (HOSPITAL_COMMUNITY): Payer: Self-pay | Admitting: Psychiatry

## 2017-07-12 ENCOUNTER — Ambulatory Visit (INDEPENDENT_AMBULATORY_CARE_PROVIDER_SITE_OTHER): Payer: Federal, State, Local not specified - PPO | Admitting: Psychiatry

## 2017-07-12 DIAGNOSIS — F3340 Major depressive disorder, recurrent, in remission, unspecified: Secondary | ICD-10-CM

## 2017-07-12 DIAGNOSIS — F339 Major depressive disorder, recurrent, unspecified: Secondary | ICD-10-CM

## 2017-07-12 DIAGNOSIS — F200 Paranoid schizophrenia: Secondary | ICD-10-CM

## 2017-07-12 MED ORDER — OLANZAPINE 5 MG PO TABS: 5.0000 mg | ORAL_TABLET | Freq: Every day | ORAL | 1 refills | Status: DC

## 2017-07-12 NOTE — Progress Notes (Signed)
BH MD/PA/NP OP Progress Note  07/12/2017 2:58 PM Michael Vega  MRN:  [id number]  Chief Complaint:  Chief Complaint    Follow-up     HPI: Pt was last seen by writer December 21, 2016. Pt states he is happy and things are going well.   Pt states he is doing well. He is working at [employer]. He is going to school at [employer] and will be transferring to [employer] in the spring. He is excited.   He is getting along well with his grandmother.  Sleep is good and he has not been taking Trazodone. Energy and appetite are good. He is making good food choices.  Pt denies depression and anxiety. He denies hopelessness and worthlessness. Pt denies SI/HI.  Pt denies AVH, paranoia, ideas of reference and magical thinking.   Pt states-taking meds as prescribed and denies SE.   Visit Diagnosis:    ICD-10-CM   1. Paranoid schizophrenia (HCC) F20.0 OLANZapine (ZYPREXA) 5 MG tablet  2. Recurrent major depressive disorder, remission status unspecified (HCC) F33.9 OLANZapine (ZYPREXA) 5 MG tablet       Past Psychiatric History:  Anxiety: No Bipolar Disorder: No Depression: Yes Mania: No Psychosis: Yes Schizophrenia: Yes Personality Disorder: No Hospitalization for psychiatric illness: Yes History of Electroconvulsive Shock Therapy: No Prior Suicide Attempts: Yes  Past Medical History:  Past Medical History:  Diagnosis Date  . Depression   . Schizophrenia (HCC)   . Schizophrenia, paranoid [facility])     Past Surgical History:  Procedure Laterality Date  . WISDOM TOOTH EXTRACTION      Family Psychiatric History:  Family History  Problem Relation Age of Onset  . ADD / ADHD Neg Hx   . Alcohol abuse Neg Hx   . Anxiety disorder Neg Hx   . Bipolar disorder Neg Hx   . Depression Neg Hx   . Schizophrenia Neg Hx     Social History:  Social History   Social History  . Marital status: Single    Spouse name: N/A  . Number of children: N/A  . Years of education: N/A    Social History Main Topics  . Smoking status: Never Smoker  . Smokeless tobacco: Never Used  . Alcohol use No  . Drug use: No  . Sexual activity: Not Currently   Other Topics Concern  . Not on file   Social History Narrative  . No narrative on file    Allergies: No Known Allergies  Metabolic Disorder Labs: Lab Results  Component Value Date   HGBA1C 5.3 01/26/2017   MPG 108 12/11/2010   Lab Results  Component Value Date   PROLACTIN 25.4 (H) 01/26/2017   Lab Results  Component Value Date   CHOL 192 01/26/2017   TRIG 146 01/26/2017   HDL 37 (L) 01/26/2017   CHOLHDL 3.9 12/11/2010   VLDL 16 12/11/2010   LDLCALC 126 (H) 01/26/2017   LDLCALC  12/11/2010    104        Total Cholesterol/HDL:CHD Risk Coronary Heart Disease Risk Table                     Men   Women  1/2 Average Risk   3.4   3.3  Average Risk       5.0   4.4  2 X Average Risk   9.6   7.1  3 X Average Risk  23.4   11.0  Use the calculated Patient Ratio above and the CHD Risk Table to determine the patient's CHD Risk.        ATP III CLASSIFICATION (LDL):  <100     mg/dL   Optimal  130-865  mg/dL   Near or Above                    Optimal  130-159  mg/dL   Borderline  784-696  mg/dL   High  >295     mg/dL   Very High   Lab Results  Component Value Date   TSH 1.200 01/26/2017    Therapeutic Level Labs: No results found for: LITHIUM No results found for: VALPROATE No components found for:  CBMZ  Current Medications: Current Outpatient Prescriptions  Medication Sig Dispense Refill  . OLANZapine (ZYPREXA) 5 MG tablet Take 1 tablet (5 mg total) by mouth daily. 45 tablet 0  . traZODone (DESYREL) 50 MG tablet Take 1 tablet (50 mg total) by mouth at bedtime as needed for sleep. 30 tablet 2   No current facility-administered medications for this visit.      Musculoskeletal: Strength & Muscle Tone: within normal limits Gait & Station: normal Patient leans: N/A  Psychiatric Specialty  Exam: Review of Systems  Constitutional: Negative for chills, diaphoresis, fever and weight loss.  Neurological: Negative for weakness.  Psychiatric/Behavioral: Negative for depression, hallucinations, memory loss, substance abuse and suicidal ideas. The patient is not nervous/anxious and does not have insomnia.     There were no vitals taken for this visit.There is no height or weight on file to calculate BMI.  General Appearance: Fairly Groomed  Eye Contact:  Good  Speech:  Clear and Coherent and Normal Rate  Volume:  Normal  Mood:  Euthymic  Affect:  Full Range  Thought Process:  Goal Directed and Descriptions of Associations: Intact  Orientation:  Full (Time, Place, and Person)  Thought Content: Logical   Suicidal Thoughts:  No  Homicidal Thoughts:  No  Memory:  Immediate;   Good Recent;   Good Remote;   Good  Judgement:  Good  Insight:  Good  Psychomotor Activity:  Normal  Concentration:  Concentration: Good and Attention Span: Good  Recall:  Good  Fund of Knowledge: Good  Language: Good  Akathisia:  No  Handed:  Right  AIMS (if indicated): not done  Assets:  Communication Skills Desire for Improvement Financial Resources/Insurance Housing Leisure Time Physical Health Resilience Social Support Talents/Skills Transportation Vocational/Educational  ADL's:  Intact  Cognition: WNL  Sleep:  Good   Screenings:   Assessment and Plan: Schizophrenia-paranoid type; MDD-recurrent- in remission   Medication management with supportive therapy. Risks/benefits and SE of the medication discussed. Pt verbalized understanding and verbal consent obtained for treatment.  Affirm with the patient that the medications are taken as ordered. Patient expressed understanding of how their medications were to be used.   Meds: Zyprexa 5mg  po qHS for schizophrenia Trazodone 50mg  po qHS prn insomnia   Labs: 01/26/2017 CMP WNL, triglycerides 126, CBC WNL, Prolactin 25.4, HbA1c 5.3, TSH  WNL  Therapy: brief supportive therapy provided. Discussed psychosocial stressors in detail.     Consultations: none  Pt denies SI and is at an acute low risk for suicide. Patient told to call clinic if any problems occur. Patient advised to go to ER if they should develop SI/HI, side effects, or if symptoms worsen. Has crisis numbers to call if needed. Pt verbalized understanding.  F/up in 6  months or sooner if needed   Oletta DarterSalina Antiono Ettinger, MD 07/12/2017, 2:58 PM

## 2017-08-05 ENCOUNTER — Ambulatory Visit
Admission: EM | Admit: 2017-08-05 | Discharge: 2017-08-05 | Disposition: A | Discharge status: Home / Self Care | Payer: Federal, State, Local not specified - PPO | Attending: Family Medicine | Admitting: Family Medicine

## 2017-08-05 ENCOUNTER — Encounter: Payer: Self-pay | Admitting: *Deleted

## 2017-08-05 ENCOUNTER — Other Ambulatory Visit: Payer: Self-pay

## 2017-08-05 DIAGNOSIS — R05 Cough: Secondary | ICD-10-CM

## 2017-08-05 DIAGNOSIS — B9789 Other viral agents as the cause of diseases classified elsewhere: Secondary | ICD-10-CM

## 2017-08-05 DIAGNOSIS — J069 Acute upper respiratory infection, unspecified: Secondary | ICD-10-CM | POA: Diagnosis not present

## 2017-08-05 LAB — RAPID STREP SCREEN (MED CTR MEBANE ONLY): Streptococcus, Group A Screen (Direct): NEGATIVE

## 2017-08-05 MED ORDER — AZITHROMYCIN 250 MG PO TABS: 250.0000 mg | ORAL_TABLET | Freq: Every day | ORAL | 0 refills | Status: DC

## 2017-08-05 NOTE — ED Triage Notes (Signed)
PAtient started having symptoms of nasal congestion, cough and sore throat 1 week ago. Cough is productive and sore throat symptom has resolved.

## 2017-08-05 NOTE — Discharge Instructions (Signed)
Please continue with your over-the-counter Alka-Seltzer cold and flu medication.  You may add guaifenesin to this regimen.  Make sure you are drinking lots of fluids. If no improvement of symptoms in 2 days you may start antibiotic.  Please return to the clinic or ED for any fevers above 101, difficulty breathing, worsening symptoms or to changes in her health.

## 2017-08-05 NOTE — ED Provider Notes (Signed)
[facility]    CSN: [id number] Arrival date & time: 08/05/17  0941     History   Chief Complaint Chief Complaint  Patient presents with  . Cough    HPI Michael Vega is a 21 y.o. male presents to the acute care clinic for evaluation of cough and congestion.  Symptoms have been present for 6 days.  Patient initially started with sore throat, lots of nasal drainage, postnasal dripping and cough.  Patient's cough has increased over the last 2 days but is nonproductive.  He has not had any fevers.  He denies any chest pain, shortness of breath or wheezing.  He is taking over-the-counter Alka-Seltzer with cough suppressant, decongestant and antihistamine.  Patient is taking these medications a couple times a day with relief.  He is drinking fluids well.  No nausea vomiting or diarrhea.  HPI  Past Medical History:  Diagnosis Date  . Depression   . Schizophrenia (HCC)   . Schizophrenia, paranoid [facility](HCC)     Patient Active Problem List   Diagnosis Date Noted  . MDD (major depressive disorder), recurrent, in full remission (HCC) 03/09/2015  . Paranoid schizophrenia (HCC) 08/04/2014    Past Surgical History:  Procedure Laterality Date  . WISDOM TOOTH EXTRACTION         Home Medications    Prior to Admission medications   Medication Sig Start Date End Date Taking? Authorizing Provider  OLANZapine (ZYPREXA) 5 MG tablet Take 1 tablet (5 mg total) by mouth daily. 07/12/17  Yes Oletta DarterAgarwal, [place], MD  azithromycin (ZITHROMAX) 250 MG tablet Take 1 tablet (250 mg total) daily by mouth. Take first 2 tablets together, then 1 every day until finished. 08/05/17   Evon SlackGaines, Lenzie Sandler C, PA-C    Family History Family History  Problem Relation Age of Onset  . ADD / ADHD Neg Hx   . Alcohol abuse Neg Hx   . Anxiety disorder Neg Hx   . Bipolar disorder Neg Hx   . Depression Neg Hx   . Schizophrenia Neg Hx     Social History Social History   Tobacco Use  . Smoking status:  Never Smoker  . Smokeless tobacco: Never Used  Substance Use Topics  . Alcohol use: No    Alcohol/week: 0.0 oz  . Drug use: No     Allergies   Patient has no known allergies.   Review of Systems Review of Systems  Constitutional: Negative.  Negative for activity change, appetite change, chills and fever.  HENT: Positive for rhinorrhea. Negative for congestion, ear pain, mouth sores, sinus pressure, sore throat and trouble swallowing.   Eyes: Negative for photophobia, pain and discharge.  Respiratory: Positive for cough. Negative for chest tightness, shortness of breath and wheezing.   Cardiovascular: Negative for chest pain and leg swelling.  Gastrointestinal: Negative for abdominal distention, abdominal pain, diarrhea, nausea and vomiting.  Musculoskeletal: Negative for arthralgias, back pain and gait problem.  Skin: Negative for color change and rash.  Neurological: Negative for dizziness and headaches.  Hematological: Negative for adenopathy.  Psychiatric/Behavioral: Negative for agitation and behavioral problems.     Physical Exam Triage Vital Signs ED Triage Vitals  Enc Vitals Group     BP 08/05/17 0957 (!) 143/87     Pulse Rate 08/05/17 0957 82     Resp 08/05/17 0957 18     Temp 08/05/17 0957 98 F (36.7 C)     Temp Source 08/05/17 0957 Oral     SpO2 08/05/17  0957 100 %     Weight 08/05/17 0959 241 lb (109.3 kg)     Height 08/05/17 0959 5\' 9"  (1.753 m)     Head Circumference --      Peak Flow --      Pain Score 08/05/17 0959 0     Pain Loc --      Pain Edu? --      Excl. in GC? --    No data found.  Updated Vital Signs BP (!) 143/87 (BP Location: Left Arm)   Pulse 82   Temp 98 F (36.7 C) (Oral)   Resp 18   Ht 5\' 9"  (1.753 m)   Wt 241 lb (109.3 kg)   SpO2 100%   BMI 35.59 kg/m   Visual Acuity Right Eye Distance:   Left Eye Distance:   Bilateral Distance:    Right Eye Near:   Left Eye Near:    Bilateral Near:     Physical Exam    Constitutional: He appears well-developed and well-nourished.  HENT:  Head: Normocephalic and atraumatic.  Right Ear: External ear normal.  Left Ear: External ear normal.  Nose: Nose normal.  Mouth/Throat: Oropharynx is clear and moist. No oropharyngeal exudate.  Mild pharyngeal erythema with no exudates.  No swelling.  No midline shift.   Eyes: Conjunctivae are normal.  Neck: Normal range of motion. Neck supple.  Cardiovascular: Normal rate and regular rhythm.  No murmur heard. Pulmonary/Chest: Effort normal and breath sounds normal. No respiratory distress. He has no wheezes. He exhibits no tenderness.  Abdominal: Soft. He exhibits no distension. There is no tenderness.  Musculoskeletal: He exhibits no edema.  Lymphadenopathy:    He has cervical adenopathy (posterior cervical).  Neurological: He is alert.  Skin: Skin is warm and dry. No rash noted.  Psychiatric: He has a normal mood and affect.  Nursing note and vitals reviewed.    UC Treatments / Results  Labs (all labs ordered are listed, but only abnormal results are displayed) Labs Reviewed  RAPID STREP SCREEN (NOT [facility])  CULTURE, GROUP A STREP [facility])    EKG  EKG Interpretation None       Radiology No results found.  Procedures Procedures (including critical care time)  Medications Ordered in UC Medications - No data to display   Initial Impression / Assessment and Plan / UC Course  I have reviewed the triage vital signs and the nursing notes.  Pertinent labs & imaging results that were available during my care of the patient were reviewed by me and considered in my medical decision making (see chart for details).     21 year old male with viral respiratory infection.  He will continue with over-the-counter cold and flu medication.  Recommend he start guaifenesin.  He will increase fluids.  He is educated on signs and symptoms return to the clinic for.  He was sent a prescription for antibiotic,  recommend he not take this medication and let us symptoms are not improving over the next 2 days.  Patient understands signs and symptoms return to the clinic 4.  Final Clinical Impressions(s) / UC Diagnoses   Final diagnoses:  Viral URI with cough    ED Discharge Orders        Ordered    azithromycin (ZITHROMAX) 250 MG tablet  Daily     08/05/17 1013         Evon SlackGaines, Veta Dambrosia C, [place]PA-C 08/05/17 1029

## 2017-08-08 LAB — CULTURE, GROUP A STREP (THRC)

## 2017-09-11 ENCOUNTER — Other Ambulatory Visit: Payer: Self-pay | Admitting: Family Medicine

## 2017-09-11 ENCOUNTER — Encounter: Payer: Self-pay | Admitting: Family Medicine

## 2017-09-11 ENCOUNTER — Ambulatory Visit (INDEPENDENT_AMBULATORY_CARE_PROVIDER_SITE_OTHER): Payer: Federal, State, Local not specified - PPO | Admitting: Family Medicine

## 2017-09-11 VITALS — BP 130/90 | HR 91 | Temp 97.5°F | Resp 16 | Ht 69.0 in | Wt 238.8 lb

## 2017-09-11 DIAGNOSIS — B360 Pityriasis versicolor: Secondary | ICD-10-CM | POA: Diagnosis not present

## 2017-09-11 DIAGNOSIS — Z23 Encounter for immunization: Secondary | ICD-10-CM

## 2017-09-11 DIAGNOSIS — Z00129 Encounter for routine child health examination without abnormal findings: Secondary | ICD-10-CM

## 2017-09-11 MED ORDER — KETOCONAZOLE 2 % EX CREA: 1.0000 "application " | TOPICAL_CREAM | Freq: Every day | CUTANEOUS | 0 refills | Status: DC

## 2017-09-11 NOTE — Progress Notes (Signed)
Subjective:     Patient ID: Michael Vega, male   DOB: 09/12/1996, 21 y.o.   MRN: [id number] Chief Complaint  Patient presents with  . Annual Exam    Patient comes in office today to update his immunizations for college   HPI Also has non-pruritic rash on his back and shoulders.  Review of Systems     Objective:   Physical Exam  Constitutional: He appears well-developed and well-nourished. No distress.  Skin:  Hyperpigmented macules on his back and shoulders. A few pustules noted.( Patient wears  tight undershirts due to gynecomastia)       Assessment:    1. Need for meningococcal vaccination - Meningococcal B, OMV - Meningococcal conjugate vaccine 4-valent IM  2. Need for HPV vaccination - HPV 9-valent vaccine,Recombinat  3. Need for prophylactic vaccination with tetanus-diphtheria (Td) - Td vaccine greater than or equal to 7yo preservative free IM  4. Encounter for childhood immunizations appropriate for age  105. Tinea versicolor - ketoconazole (NIZORAL) 2 % cream; Apply 1 application topically daily. Treat daily for 2 weeks then once a month when improved.  Dispense: 60 g; Refill: 0    Plan:    F/u in two months for next immunizations.

## 2017-09-11 NOTE — Patient Instructions (Addendum)
Return in > 2 months to complete meningitis shots and second HPV.

## 2017-11-10 ENCOUNTER — Encounter: Payer: Self-pay | Admitting: Family Medicine

## 2017-11-10 ENCOUNTER — Ambulatory Visit (INDEPENDENT_AMBULATORY_CARE_PROVIDER_SITE_OTHER): Payer: Federal, State, Local not specified - PPO | Admitting: Family Medicine

## 2017-11-10 VITALS — BP 104/72 | HR 89 | Temp 97.9°F | Resp 16 | Wt 246.0 lb

## 2017-11-10 DIAGNOSIS — J069 Acute upper respiratory infection, unspecified: Secondary | ICD-10-CM | POA: Diagnosis not present

## 2017-11-10 NOTE — Progress Notes (Signed)
       Patient: Michael Vega Male    DOB: 05/14/1996   22 y.o.   MRN: [id number] Visit Date: 11/10/2017  Today's Provider: Mila Merryonald Gretna Bergin, MD   Chief Complaint  Patient presents with  . URI   Subjective:    URI   This is a new problem. Episode onset: x 2-3 days. The problem has been unchanged. There has been no fever. Associated symptoms include congestion, coughing, headaches, rhinorrhea, sinus pain, sneezing, a sore throat and swollen glands. Pertinent negatives include no abdominal pain, chest pain, diarrhea, dysuria, ear pain, joint pain, joint swelling, nausea, neck pain, plugged ear sensation, vomiting or wheezing. Treatments tried: NyQuil Severe Cold and Flu. The treatment provided mild relief.       No Known Allergies   Current Outpatient Medications:  .  OLANZapine (ZYPREXA) 5 MG tablet, Take 1 tablet (5 mg total) by mouth daily., Disp: 90 tablet, Rfl: 1  Review of Systems  HENT: Positive for congestion, rhinorrhea, sinus pain, sneezing and sore throat. Negative for ear pain.   Respiratory: Positive for cough. Negative for wheezing.   Cardiovascular: Negative for chest pain.  Gastrointestinal: Negative for abdominal pain, diarrhea, nausea and vomiting.  Genitourinary: Negative for dysuria.  Musculoskeletal: Negative for joint pain and neck pain.  Neurological: Positive for headaches.    Social History   Tobacco Use  . Smoking status: Never Smoker  . Smokeless tobacco: Never Used  Substance Use Topics  . Alcohol use: No    Alcohol/week: 0.0 oz   Objective:   BP 104/72 (BP Location: Left Arm, Patient Position: Sitting, Cuff Size: Large)   Pulse 89   Temp 97.9 F (36.6 C) (Oral)   Resp 16   Wt 246 lb (111.6 kg)   SpO2 94%   BMI 36.33 kg/m  Vitals:   11/10/17 0950  BP: 104/72  Pulse: 89  Resp: 16  Temp: 97.9 F (36.6 C)  TempSrc: Oral  SpO2: 94%  Weight: 246 lb (111.6 kg)     Physical Exam  General Appearance:    Alert, cooperative, no  distress  HENT:   bilateral TM normal without fluid or infection, neck without nodes, throat normal without erythema or exudate, sinuses nontender and nasal mucosa pale and congested  Eyes:    PERRL, conjunctiva/corneas clear, EOM's intact       Lungs:     Clear to auscultation bilaterally, respirations unlabored  Heart:    Regular rate and rhythm  Neurologic:   Awake, alert, oriented x 3. No apparent focal neurological           defect.           Assessment & Plan:     1. Upper respiratory tract infection, unspecified type Counseled regarding signs and symptoms of viral and bacterial respiratory infections. Advised to call or return for additional evaluation if he develops any sign of bacterial infection, or if current symptoms last longer than 10 days.         Mila Merryonald Ryle Buscemi, MD  [facility]

## 2018-01-10 ENCOUNTER — Ambulatory Visit (HOSPITAL_COMMUNITY): Payer: Federal, State, Local not specified - PPO | Admitting: Psychiatry

## 2018-01-29 ENCOUNTER — Other Ambulatory Visit (HOSPITAL_COMMUNITY): Payer: Self-pay | Admitting: Psychiatry

## 2018-01-29 DIAGNOSIS — F339 Major depressive disorder, recurrent, unspecified: Secondary | ICD-10-CM

## 2018-01-29 DIAGNOSIS — F200 Paranoid schizophrenia: Secondary | ICD-10-CM

## 2018-02-02 ENCOUNTER — Ambulatory Visit (HOSPITAL_COMMUNITY): Payer: Federal, State, Local not specified - PPO | Admitting: Psychiatry

## 2018-02-21 ENCOUNTER — Encounter (HOSPITAL_COMMUNITY): Payer: Self-pay | Admitting: Psychiatry

## 2018-02-21 ENCOUNTER — Ambulatory Visit (INDEPENDENT_AMBULATORY_CARE_PROVIDER_SITE_OTHER): Payer: Federal, State, Local not specified - PPO | Admitting: Psychiatry

## 2018-02-21 VITALS — BP 126/84 | HR 74 | Ht 69.0 in | Wt 224.6 lb

## 2018-02-21 DIAGNOSIS — Z79899 Other long term (current) drug therapy: Secondary | ICD-10-CM

## 2018-02-21 DIAGNOSIS — F200 Paranoid schizophrenia: Secondary | ICD-10-CM | POA: Diagnosis not present

## 2018-02-21 DIAGNOSIS — F339 Major depressive disorder, recurrent, unspecified: Secondary | ICD-10-CM

## 2018-02-21 MED ORDER — OLANZAPINE 5 MG PO TABS: 5.0000 mg | ORAL_TABLET | Freq: Every day | ORAL | 1 refills | Status: DC

## 2018-02-21 NOTE — Progress Notes (Signed)
BH MD/PA/NP OP Progress Note  02/21/2018 11:10 AM Michael Vega  MRN:  [id number]  Chief Complaint:  Chief Complaint    Follow-up     HPI: Patient states he is doing really well.  He has completed 1 semester at [employer] and has signed up for 3 classes for next semester.  He is studying psychology.  He states that his been a great experience for him and he is very proud of his success.  He has made some friends.  This summer he is working with [employer] and [employer] again.  Sleep is good and energy is good.  He denies symptoms of schizophrenia.  He denies anxiety and all symptoms of depression.  He denies SI/HI.  He is taking his Zyprexa as prescribed and denies side effects.  Visit Diagnosis:    ICD-10-CM   1. Paranoid schizophrenia (HCC) F20.0 OLANZapine (ZYPREXA) 5 MG tablet  2. Recurrent major depressive disorder, remission status unspecified (HCC) F33.9 OLANZapine (ZYPREXA) 5 MG tablet    Past Psychiatric History:  Anxiety:No Bipolar Disorder:No Depression:Yes Mania:No Psychosis:Yes Schizophrenia:Yes Personality Disorder:No Hospitalization for psychiatric illness:Yes History of Electroconvulsive Shock Therapy:No Prior Suicide Attempts:Yes    Past Medical History:  Past Medical History:  Diagnosis Date  . Depression   . Schizophrenia (HCC)   . Schizophrenia, paranoid [facility])     Past Surgical History:  Procedure Laterality Date  . WISDOM TOOTH EXTRACTION      Family Psychiatric History:  Family History  Problem Relation Age of Onset  . ADD / ADHD Neg Hx   . Alcohol abuse Neg Hx   . Anxiety disorder Neg Hx   . Bipolar disorder Neg Hx   . Depression Neg Hx   . Schizophrenia Neg Hx     Social History:  Social History   Socioeconomic History  . Marital status: Single    Spouse name: Not on file  . Number of children: Not on file  . Years of education: Not on file  . Highest education level: Not on file  Occupational History  . Not on file  Social Needs   . Financial resource strain: Not on file  . Food insecurity:    Worry: Not on file    Inability: Not on file  . Transportation needs:    Medical: Not on file    Non-medical: Not on file  Tobacco Use  . Smoking status: Never Smoker  . Smokeless tobacco: Never Used  Substance and Sexual Activity  . Alcohol use: No    Alcohol/week: 0.0 oz  . Drug use: No  . Sexual activity: Not Currently  Lifestyle  . Physical activity:    Days per week: Not on file    Minutes per session: Not on file  . Stress: Not on file  Relationships  . Social connections:    Talks on phone: Not on file    Gets together: Not on file    Attends religious service: Not on file    Active member of club or organization: Not on file    Attends meetings of clubs or organizations: Not on file    Relationship status: Not on file  Other Topics Concern  . Not on file  Social History Narrative  . Not on file    Allergies: No Known Allergies  Metabolic Disorder Labs: Lab Results  Component Value Date   HGBA1C 5.3 01/26/2017   MPG 108 12/11/2010   Lab Results  Component Value Date   PROLACTIN 25.4 (H) 01/26/2017  Lab Results  Component Value Date   CHOL 192 01/26/2017   TRIG 146 01/26/2017   HDL 37 (L) 01/26/2017   CHOLHDL 3.9 12/11/2010   VLDL 16 12/11/2010   LDLCALC 126 (H) 01/26/2017   LDLCALC  12/11/2010    104        Total Cholesterol/HDL:CHD Risk Coronary Heart Disease Risk Table                     Men   Women  1/2 Average Risk   3.4   3.3  Average Risk       5.0   4.4  2 X Average Risk   9.6   7.1  3 X Average Risk  23.4   11.0        Use the calculated Patient Ratio above and the CHD Risk Table to determine the patient's CHD Risk.        ATP III CLASSIFICATION (LDL):  <100     mg/dL   Optimal  811-914  mg/dL   Near or Above                    Optimal  130-159  mg/dL   Borderline  782-956  mg/dL   High  >213     mg/dL   Very High   Lab Results  Component Value Date   TSH  1.200 01/26/2017    Therapeutic Level Labs: No results found for: LITHIUM No results found for: VALPROATE No components found for:  CBMZ  Current Medications: Current Outpatient Medications  Medication Sig Dispense Refill  . OLANZapine (ZYPREXA) 5 MG tablet Take 1 tablet (5 mg total) by mouth daily. 90 tablet 1   No current facility-administered medications for this visit.      Musculoskeletal: Strength & Muscle Tone: within normal limits Gait & Station: normal Patient leans: N/A  Psychiatric Specialty Exam: Review of Systems  Constitutional: Negative for chills, fever and malaise/fatigue.  Respiratory: Negative for cough, hemoptysis, sputum production and shortness of breath.     Blood pressure 126/84, pulse 74, height  (1.753 m), weight 224 lb 9.6 oz (101.9 kg), SpO2 99 %.Body mass index is 33.17 kg/m.  General Appearance: Casual  Eye Contact:  Good  Speech:  Clear and Coherent and Normal Rate  Volume:  Normal  Mood:  Euthymic  Affect:  Full Range  Thought Process:  Goal Directed and Descriptions of Associations: Intact  Orientation:  Full (Time, Place, and Person)  Thought Content: Negative   Suicidal Thoughts:  No  Homicidal Thoughts:  No  Memory:  Immediate;   Good Recent;   Good Remote;   Good  Judgement:  Good  Insight:  Good  Psychomotor Activity:  Normal  Concentration:  Concentration: Good and Attention Span: Good  Recall:  Good  Fund of Knowledge: Good  Language: Good  Akathisia:  No  Handed:  Right  AIMS (if indicated): done  Assets:  Communication Skills Desire for Improvement Financial Resources/Insurance Housing Leisure Time Physical Health Resilience Social Support Talents/Skills Transportation Vocational/Educational  ADL's:  Intact  Cognition: WNL  Sleep:  Good   Screenings:   Assessment and Plan: Schizoaffective -paranoid type; MDD recurrent in remission    Medication management with supportive therapy. Risks and  benefits, side effects and alternative treatment options discussed with patient. Pt was given an opportunity to ask questions about medication, illness, and treatment. All current psychiatric medications have been reviewed and discussed with the patient  and adjusted as clinically appropriate. The patient has been provided an accurate and updated list of the medications being now prescribed. Patient expressed understanding of how their medications were to be used.  Pt verbalized understanding and verbal consent obtained for treatment.   Status of current problems: stable  Meds: Zyprexa 5 mg p.o. nightly for schizophrenia AIMS=0   Labs: order CBC, CMP, HbA1c, Lipid panel, TSH, Prolactin level   Therapy: brief supportive therapy provided. Discussed psychosocial stressors in detail.     Consultations: Encouraged to follow up with PCP as needed  Pt denies SI and is at an acute low risk for suicide. Patient told to call clinic if any problems occur. Patient advised to go to ER if they should develop SI/HI, side effects, or if symptoms worsen. Has crisis numbers to call if needed. Pt verbalized understanding.  F/up in 6 months or sooner if needed  The duration of this appointment visit was 15 minutes of face-to-face time with the patient.  Greater than 50% of this time was spent in counseling, explanation of  diagnosis, planning of further management, and coordination of care      Oletta Darter, MD 02/21/2018, 11:10 AM

## 2018-02-21 NOTE — Addendum Note (Signed)
Addended by: Georgian Co E on: 02/21/2018 11:36 AM   Modules accepted: Orders

## 2018-02-22 LAB — CBC WITH DIFFERENTIAL/PLATELET
BASOS ABS: 0 10*3/uL (ref 0.0–0.2)
Basos: 1 %
EOS (ABSOLUTE): 0.2 10*3/uL (ref 0.0–0.4)
EOS: 5 %
HEMATOCRIT: 49.5 % (ref 37.5–51.0)
Hemoglobin: 16.7 g/dL (ref 13.0–17.7)
Immature Grans (Abs): 0 10*3/uL (ref 0.0–0.1)
Immature Granulocytes: 0 %
LYMPHS ABS: 1.4 10*3/uL (ref 0.7–3.1)
Lymphs: 35 %
MCH: 28.8 pg (ref 26.6–33.0)
MCHC: 33.7 g/dL (ref 31.5–35.7)
MCV: 86 fL (ref 79–97)
MONOS ABS: 0.3 10*3/uL (ref 0.1–0.9)
Monocytes: 7 %
NEUTROS PCT: 52 %
Neutrophils Absolute: 2.1 10*3/uL (ref 1.4–7.0)
PLATELETS: 252 10*3/uL (ref 150–450)
RBC: 5.79 x10E6/uL (ref 4.14–5.80)
RDW: 14.2 % (ref 12.3–15.4)
WBC: 4 10*3/uL (ref 3.4–10.8)

## 2018-02-22 LAB — COMPREHENSIVE METABOLIC PANEL
A/G RATIO: 2 (ref 1.2–2.2)
ALBUMIN: 4.7 g/dL (ref 3.5–5.5)
ALK PHOS: 58 IU/L (ref 39–117)
ALT: 15 IU/L (ref 0–44)
AST: 21 IU/L (ref 0–40)
BILIRUBIN TOTAL: 0.3 mg/dL (ref 0.0–1.2)
BUN / CREAT RATIO: 7 — AB (ref 9–20)
BUN: 8 mg/dL (ref 6–20)
CHLORIDE: 102 mmol/L (ref 96–106)
CO2: 24 mmol/L (ref 20–29)
Calcium: 9.6 mg/dL (ref 8.7–10.2)
Creatinine, Ser: 1.07 mg/dL (ref 0.76–1.27)
GFR calc Af Amer: 113 mL/min/{1.73_m2} (ref >59)
GFR calc non Af Amer: 98 mL/min/{1.73_m2} (ref >59)
GLOBULIN, TOTAL: 2.3 g/dL (ref 1.5–4.5)
Glucose: 85 mg/dL (ref 65–99)
POTASSIUM: 5.4 mmol/L — AB (ref 3.5–5.2)
SODIUM: 141 mmol/L (ref 134–144)
Total Protein: 7 g/dL (ref 6.0–8.5)

## 2018-02-22 LAB — LIPID PANEL
CHOL/HDL RATIO: 4.4 ratio (ref 0.0–5.0)
Cholesterol, Total: 162 mg/dL (ref 100–199)
HDL: 37 mg/dL — AB (ref >39)
LDL CALC: 107 mg/dL — AB (ref 0–99)
TRIGLYCERIDES: 91 mg/dL (ref 0–149)
VLDL Cholesterol Cal: 18 mg/dL (ref 5–40)

## 2018-02-22 LAB — TSH: TSH: 1.18 u[IU]/mL (ref 0.450–4.500)

## 2018-02-22 LAB — HEMOGLOBIN A1C
Est. average glucose Bld gHb Est-mCnc: 100 mg/dL
HEMOGLOBIN A1C: 5.1 % (ref 4.8–5.6)

## 2018-02-22 LAB — PROLACTIN: PROLACTIN: 15.2 ng/mL (ref 4.0–15.2)

## 2018-08-12 ENCOUNTER — Other Ambulatory Visit (HOSPITAL_COMMUNITY): Payer: Self-pay | Admitting: Psychiatry

## 2018-08-12 DIAGNOSIS — F339 Major depressive disorder, recurrent, unspecified: Secondary | ICD-10-CM

## 2018-08-12 DIAGNOSIS — F200 Paranoid schizophrenia: Secondary | ICD-10-CM

## 2018-08-29 ENCOUNTER — Ambulatory Visit (HOSPITAL_COMMUNITY): Payer: Self-pay | Admitting: Psychiatry

## 2018-09-09 ENCOUNTER — Other Ambulatory Visit (HOSPITAL_COMMUNITY): Payer: Self-pay | Admitting: Psychiatry

## 2018-09-09 DIAGNOSIS — F200 Paranoid schizophrenia: Secondary | ICD-10-CM

## 2018-09-09 DIAGNOSIS — F339 Major depressive disorder, recurrent, unspecified: Secondary | ICD-10-CM

## 2018-09-10 ENCOUNTER — Other Ambulatory Visit (HOSPITAL_COMMUNITY): Payer: Self-pay

## 2018-09-10 DIAGNOSIS — F200 Paranoid schizophrenia: Secondary | ICD-10-CM

## 2018-09-10 DIAGNOSIS — F339 Major depressive disorder, recurrent, unspecified: Secondary | ICD-10-CM

## 2018-09-10 MED ORDER — OLANZAPINE 5 MG PO TABS: 5.0000 mg | ORAL_TABLET | Freq: Every day | ORAL | 0 refills | Status: DC

## 2018-09-11 ENCOUNTER — Other Ambulatory Visit (HOSPITAL_COMMUNITY): Payer: Self-pay

## 2018-09-11 DIAGNOSIS — F200 Paranoid schizophrenia: Secondary | ICD-10-CM

## 2018-09-11 DIAGNOSIS — F339 Major depressive disorder, recurrent, unspecified: Secondary | ICD-10-CM

## 2018-09-11 MED ORDER — OLANZAPINE 5 MG PO TABS: 5.0000 mg | ORAL_TABLET | Freq: Every day | ORAL | 0 refills | Status: DC

## 2018-10-03 ENCOUNTER — Other Ambulatory Visit (HOSPITAL_COMMUNITY): Payer: Self-pay | Admitting: Psychiatry

## 2018-10-03 DIAGNOSIS — F200 Paranoid schizophrenia: Secondary | ICD-10-CM

## 2018-10-03 DIAGNOSIS — F339 Major depressive disorder, recurrent, unspecified: Secondary | ICD-10-CM

## 2018-10-10 ENCOUNTER — Encounter (HOSPITAL_COMMUNITY): Payer: Self-pay | Admitting: Psychiatry

## 2018-10-10 ENCOUNTER — Ambulatory Visit (INDEPENDENT_AMBULATORY_CARE_PROVIDER_SITE_OTHER): Payer: Federal, State, Local not specified - PPO | Admitting: Psychiatry

## 2018-10-10 DIAGNOSIS — F200 Paranoid schizophrenia: Secondary | ICD-10-CM

## 2018-10-10 DIAGNOSIS — F339 Major depressive disorder, recurrent, unspecified: Secondary | ICD-10-CM

## 2018-10-10 MED ORDER — OLANZAPINE 5 MG PO TABS: 5.0000 mg | ORAL_TABLET | Freq: Every day | ORAL | 1 refills | Status: DC

## 2018-10-10 NOTE — Progress Notes (Signed)
BH MD/PA/NP OP Progress Note  10/10/2018 12:10 PM Michael Vega  MRN:  [id number]  Chief Complaint:  Chief Complaint    Follow-up; Medication Refill     HPI: Michael NeedleMichael reports that he is doing well and denies any concerns or complaints at this time.  He has been taking his medication as prescribed and is denying any side effects.  He is denying any depression, isolation, anhedonia, hopelessness.  He denies any manic or hypomanic-like symptoms.  He states sleep, appetite, energy are good.  He denies hallucinations, magical thinking, ideas of reference, paranoia.  He is denying SI/HI.  He is excited as he is almost a Holiday representativejunior and his classes are going well.  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  He continues to work with [employer].  His only recent stressor is car problems.  He is having to borrow his mother's car to get to school and work which has been somewhat frustrating since is been going on since October.  The car is currently in the shop and he hopes to get it back soon.  Since he has not been able to go to the gym due to lack of transportation he notes that he has put on a couple pounds.  He is attempting to modify his diet.   Visit Diagnosis:    ICD-10-CM   1. Paranoid schizophrenia (HCC) F20.0 OLANZapine (ZYPREXA) 5 MG tablet  2. Recurrent major depressive disorder, remission status unspecified (HCC) F33.9 OLANZapine (ZYPREXA) 5 MG tablet    Past Psychiatric History:  Anxiety:No Bipolar Disorder:No Depression:Yes Mania:No Psychosis:Yes Schizophrenia:Yes Personality Disorder:No Hospitalization for psychiatric illness:Yes History of Electroconvulsive Shock Therapy:No Prior Suicide Attempts:Yes    Past Medical History:  Past Medical History:  Diagnosis Date  . Depression   . Schizophrenia (HCC)   . Schizophrenia, paranoid [facility](HCC)     Past Surgical History:  Procedure Laterality Date  . WISDOM TOOTH EXTRACTION      Family Psychiatric History:   Family History  Problem Relation Age of Onset  . ADD / ADHD Neg Hx   . Alcohol abuse Neg Hx   . Anxiety disorder Neg Hx   . Bipolar disorder Neg Hx   . Depression Neg Hx   . Schizophrenia Neg Hx     Social History:  Social History   Socioeconomic History  . Marital status: Single    Spouse name: Not on file  . Number of children: Not on file  . Years of education: Not on file  . Highest education level: Not on file  Occupational History  . Not on file  Social Needs  . Financial resource strain: Not on file  . Food insecurity:    Worry: Not on file    Inability: Not on file  . Transportation needs:    Medical: Not on file    Non-medical: Not on file  Tobacco Use  . Smoking status: Never Smoker  . Smokeless tobacco: Never Used  Substance and Sexual Activity  . Alcohol use: No    Alcohol/week: 0.0 standard drinks  . Drug use: No  . Sexual activity: Not Currently  Lifestyle  . Physical activity:    Days per week: Not on file    Minutes per session: Not on file  . Stress: Not on file  Relationships  . Social connections:    Talks on phone: Not on file    Gets together: Not on file    Attends religious service: Not on file  Active member of club or organization: Not on file    Attends meetings of clubs or organizations: Not on file    Relationship status: Not on file  Other Topics Concern  . Not on file  Social History Narrative  . Not on file    Allergies: No Known Allergies  Metabolic Disorder Labs: Lab Results  Component Value Date   HGBA1C 5.1 02/21/2018   MPG 108 12/11/2010   Lab Results  Component Value Date   PROLACTIN 15.2 02/21/2018   PROLACTIN 25.4 (H) 01/26/2017   Lab Results  Component Value Date   CHOL 162 02/21/2018   TRIG 91 02/21/2018   HDL 37 (L) 02/21/2018   CHOLHDL 4.4 02/21/2018   VLDL 16 12/11/2010   LDLCALC 107 (H) 02/21/2018   LDLCALC 126 (H) 01/26/2017   Lab Results  Component Value Date   TSH 1.180 02/21/2018    TSH 1.200 01/26/2017    Therapeutic Level Labs: No results found for: LITHIUM No results found for: VALPROATE No components found for:  CBMZ  Current Medications: Current Outpatient Medications  Medication Sig Dispense Refill  . OLANZapine (ZYPREXA) 5 MG tablet Take 1 tablet (5 mg total) by mouth daily. 90 tablet 1   No current facility-administered medications for this visit.      Musculoskeletal: Strength & Muscle Tone: within normal limits Gait & Station: normal Patient leans: N/A  Psychiatric Specialty Exam: Review of Systems  Constitutional: Negative for chills, diaphoresis and fever.  HENT: Negative for congestion, hearing loss, sinus pain and sore throat.     Blood pressure 136/74, pulse 74, height 5\' 9"  (1.753 m), weight 235 lb (106.6 kg), SpO2 94 %.Body mass index is 34.7 kg/m.  General Appearance: Casual and Fairly Groomed  Eye Contact:  Good  Speech:  Clear and Coherent and Normal Rate  Volume:  Normal  Mood:  Euthymic  Affect:  Full Range  Thought Process:  Goal Directed and Descriptions of Associations: Intact  Orientation:  Full (Time, Place, and Person)  Thought Content:  Logical  Suicidal Thoughts:  No  Homicidal Thoughts:  No  Memory:  Immediate;   Good  Judgement:  Good  Insight:  Good  Psychomotor Activity:  Normal  Concentration:  Concentration: Good  Recall:  Good  Fund of Knowledge:  Good  Language:  Good  Akathisia:  No  Handed:  Right  AIMS (if indicated):     Assets:  Communication Skills Desire for Improvement Financial Resources/Insurance Housing Leisure Time Physical Health Resilience Social Support Talents/Skills Transportation Vocational/Educational  ADL's:  Intact  Cognition:  WNL  Sleep:   good     Screenings:  I reviewed the information below on 10/10/2018 and have updated it Assessment and Plan: Schizoaffective -paranoid type; MDD recurrent in remission    Medication management with supportive therapy. Risks  and benefits, side effects and alternative treatment options discussed with patient. Pt was given an opportunity to ask questions about medication, illness, and treatment. All current psychiatric medications have been reviewed and discussed with the patient and adjusted as clinically appropriate. The patient has been provided an accurate and updated list of the medications being now prescribed. Patient expressed understanding of how their medications were to be used.  Pt verbalized understanding and verbal consent obtained for treatment.   Status of current problems: stable- baseline  Meds: Zyprexa 5 mg p.o. nightly for schizophrenia AIMS=0   Labs: 02/21/2018 -CBC, CMP, HbA1c, Lipid panel, TSH, Prolactin level- all WNL.  Will order labs  at next visit   Therapy: brief supportive therapy provided. Discussed psychosocial stressors in detail.     Consultations: Encouraged to follow up with PCP as needed  Pt denies SI and is at an acute low risk for suicide. Patient told to call clinic if any problems occur. Patient advised to go to ER if they should develop SI/HI, side effects, or if symptoms worsen. Has crisis numbers to call if needed. Pt verbalized understanding.  F/up in 6 months or sooner if needed  The duration of this appointment visit was 15 minutes of face-to-face time with the patient.  Greater than 50% of this time was spent in counseling, explanation of  diagnosis, planning of further management, and coordination of care      Oletta DarterSalina Alanee Ting, MD 10/10/2018, 12:10 PM

## 2018-12-05 ENCOUNTER — Ambulatory Visit (INDEPENDENT_AMBULATORY_CARE_PROVIDER_SITE_OTHER): Payer: Federal, State, Local not specified - PPO | Admitting: Physician Assistant

## 2018-12-05 ENCOUNTER — Other Ambulatory Visit: Payer: Self-pay

## 2018-12-05 ENCOUNTER — Encounter: Payer: Self-pay | Admitting: Physician Assistant

## 2018-12-05 VITALS — BP 138/83 | HR 102 | Temp 98.9°F | Wt 239.4 lb

## 2018-12-05 DIAGNOSIS — J111 Influenza due to unidentified influenza virus with other respiratory manifestations: Secondary | ICD-10-CM

## 2018-12-05 DIAGNOSIS — R69 Illness, unspecified: Secondary | ICD-10-CM

## 2018-12-05 DIAGNOSIS — J029 Acute pharyngitis, unspecified: Secondary | ICD-10-CM | POA: Diagnosis not present

## 2018-12-05 LAB — POCT RAPID STREP A (OFFICE): Rapid Strep A Screen: NEGATIVE

## 2018-12-05 MED ORDER — OSELTAMIVIR PHOSPHATE 75 MG PO CAPS
75.0000 mg | ORAL_CAPSULE | Freq: Two times a day (BID) | ORAL | 0 refills | Status: AC
Start: 2018-12-05 — End: 2018-12-10

## 2018-12-05 NOTE — Patient Instructions (Signed)

## 2018-12-05 NOTE — Progress Notes (Signed)
Patient: Michael Vega Male    DOB: [birth year]   23 y.o.   MRN: [id number] Visit Date: 12/05/2018  Today's Provider: Trey Sailors, PA-C   Chief Complaint  Patient presents with  . URI   Subjective:    URI   This is a new problem. The current episode started yesterday. The problem has been gradually worsening. The maximum temperature recorded prior to his arrival was 100.4 - 100.9 F (Low grade). Associated symptoms include congestion, coughing, headaches and a sore throat. He has tried acetaminophen and decongestant for the symptoms. The treatment provided mild relief.   Pulse Readings from Last 3 Encounters:  12/05/18 (!) 102  10/10/18 74  02/21/18 74      No Known Allergies   Current Outpatient Medications:  .  OLANZapine (ZYPREXA) 5 MG tablet, Take 1 tablet (5 mg total) by mouth daily., Disp: 90 tablet, Rfl: 1  Review of Systems  Constitutional: Positive for chills and fever.  HENT: Positive for congestion and sore throat.   Respiratory: Positive for cough.   Neurological: Positive for headaches.    Social History   Tobacco Use  . Smoking status: Never Smoker  . Smokeless tobacco: Never Used  Substance Use Topics  . Alcohol use: No    Alcohol/week: 0.0 standard drinks      Objective:   BP 138/83 (BP Location: Right Arm, Patient Position: Sitting, Cuff Size: Large)   Pulse (!) 102   Temp 98.9 F (37.2 C) (Oral)   Wt 239 lb 6.4 oz (108.6 kg)   SpO2 96%   BMI 35.35 kg/m  Vitals:   12/05/18 1534  BP: 138/83  Pulse: (!) 102  Temp: 98.9 F (37.2 C)  TempSrc: Oral  SpO2: 96%  Weight: 239 lb 6.4 oz (108.6 kg)     Physical Exam Constitutional:      Appearance: Normal appearance.  HENT:     Right Ear: Tympanic membrane and ear canal normal.     Left Ear: Tympanic membrane and ear canal normal.     Mouth/Throat:     Mouth: Mucous membranes are moist.     Pharynx: Oropharynx is clear.  Cardiovascular:     Rate and Rhythm: Tachycardia  present.     Pulses: Normal pulses.     Heart sounds: Normal heart sounds.  Pulmonary:     Effort: Pulmonary effort is normal.     Breath sounds: Normal breath sounds.  Lymphadenopathy:     Cervical: No cervical adenopathy.  Skin:    General: Skin is warm and dry.  Neurological:     Mental Status: He is alert and oriented to person, place, and time. Mental status is at baseline.  Psychiatric:        Mood and Affect: Mood normal.        Behavior: Behavior normal.         Assessment & Plan    1. Influenza-like illness  - oseltamivir (TAMIFLU) 75 MG capsule; Take 1 capsule (75 mg total) by mouth 2 (two) times daily for 5 days.  Dispense: 10 capsule; Refill: 0  2. Sore throat  - POCT rapid strep A  The entirety of the information documented in the History of Present Illness, Review of Systems and Physical Exam were personally obtained by me. Portions of this information were initially documented by Hetty Ely, CMA and reviewed by me for thoroughness and accuracy.   F/u PRN.  Trinna Post, PA-C  [facility]

## 2019-03-26 ENCOUNTER — Encounter: Payer: Self-pay | Admitting: Family Medicine

## 2019-03-26 ENCOUNTER — Ambulatory Visit (INDEPENDENT_AMBULATORY_CARE_PROVIDER_SITE_OTHER): Payer: Federal, State, Local not specified - PPO | Admitting: Family Medicine

## 2019-03-26 ENCOUNTER — Telehealth: Payer: Self-pay | Admitting: *Deleted

## 2019-03-26 DIAGNOSIS — M791 Myalgia, unspecified site: Secondary | ICD-10-CM

## 2019-03-26 DIAGNOSIS — R197 Diarrhea, unspecified: Secondary | ICD-10-CM

## 2019-03-26 DIAGNOSIS — R6889 Other general symptoms and signs: Secondary | ICD-10-CM

## 2019-03-26 DIAGNOSIS — Z20822 Contact with and (suspected) exposure to covid-19: Secondary | ICD-10-CM

## 2019-03-26 DIAGNOSIS — R509 Fever, unspecified: Secondary | ICD-10-CM | POA: Diagnosis not present

## 2019-03-26 NOTE — Telephone Encounter (Signed)
-----   Message from Virginia Crews, MD sent at 03/26/2019  1:29 PM EDT ----- Fever, headache, diarrhea, chills, myalgias Please schedule OP COVID testing. Thanks!

## 2019-03-26 NOTE — Progress Notes (Signed)
Patient: Michael Vega Male    DOB: 08/20/1996   23 y.o.   MRN: [id number] Visit Date: 03/26/2019  Today's Provider: Lavon Paganini, MD   Chief Complaint  Patient presents with  . URI   Subjective:    Virtual Visit via Video Note  I connected with Michael Vega on 03/26/19 at  1:20 PM EDT by a video enabled telemedicine application and verified that I am speaking with the correct person using two identifiers.   Patient location: home Provider location: [place] involved in the visit: patient, provider   I discussed the limitations of evaluation and management by telemedicine and the availability of in person appointments. The patient expressed understanding and agreed to proceed.  URI  This is a new problem. Episode onset: Monday. The problem has been gradually improving. The maximum temperature recorded prior to his arrival was 101 - 101.9 F. Associated symptoms include diarrhea and headaches. Associated symptoms comments: myalgias, fever, and chills. He has tried NSAIDs for the symptoms. The treatment provided mild relief.   Started with stomach pain 2 days ago and then had loose stools 3-4 times.  Then started having fever yesterday with chills, myalgias. No cough or chest pain. No chills or myalgias currently. Tmax 101.5 this AM.  Took excedrin and ibuprofen  No Known Allergies   Current Outpatient Medications:  .  OLANZapine (ZYPREXA) 5 MG tablet, Take 1 tablet (5 mg total) by mouth daily., Disp: 90 tablet, Rfl: 1  Review of Systems  Constitutional: Positive for chills and fever.  Respiratory: Negative.   Cardiovascular: Negative.   Gastrointestinal: Positive for diarrhea.  Musculoskeletal: Negative.   Neurological: Positive for headaches.    Social History   Tobacco Use  . Smoking status: Never Smoker  . Smokeless tobacco: Never Used  Substance Use Topics  . Alcohol use: No    Alcohol/week: 0.0 standard drinks       Objective:   There were no vitals taken for this visit. There were no vitals filed for this visit.   Physical Exam Constitutional:      Appearance: Normal appearance.  HENT:     Head: Normocephalic and atraumatic.  Pulmonary:     Effort: Pulmonary effort is normal. No respiratory distress.  Neurological:     Mental Status: He is alert and oriented to person, place, and time.      No results found for any visits on 03/26/19.     Assessment & Plan    I discussed the assessment and treatment plan with the patient. The patient was provided an opportunity to ask questions and all were answered. The patient agreed with the plan and demonstrated an understanding of the instructions.   The patient was advised to call back or seek an in-person evaluation if the symptoms worsen or if the condition fails to improve as anticipated.  1. Suspected Covid-19 Virus Infection 2. Diarrhea of presumed infectious origin 3. Fever, unspecified fever cause 4. Myalgia - new problem x2 days -Discussed that constellation of symptoms could be concerning for COVID-19 infection - We will order testing outpatient - Discussed symptom management and return precautions - Discussed need to self quarantine with further instructions about duration pending test results - He lives with his 52 year old grandmother and I advised him to remain distance from her and disinfect the house and wear a mask as possible - Temperature monitoring; Future    Return if symptoms worsen or fail to improve.  The entirety of the information documented in the History of Present Illness, Review of Systems and Physical Exam were personally obtained by me. Portions of this information were initially documented by Presley RaddleNikki Walston, CMA and reviewed by me for thoroughness and accuracy.    Marilynn Ekstein, Marzella SchleinAngela M, MD MPH [facility]

## 2019-03-26 NOTE — Telephone Encounter (Signed)
Scheduled patient for testing tomorrow morning at 8:15 am at [facility].  Testing protocol reviewed with patient.

## 2019-03-27 ENCOUNTER — Other Ambulatory Visit: Payer: Self-pay

## 2019-03-27 DIAGNOSIS — Z20822 Contact with and (suspected) exposure to covid-19: Secondary | ICD-10-CM

## 2019-03-30 ENCOUNTER — Encounter (INDEPENDENT_AMBULATORY_CARE_PROVIDER_SITE_OTHER): Payer: Self-pay

## 2019-03-31 ENCOUNTER — Encounter (INDEPENDENT_AMBULATORY_CARE_PROVIDER_SITE_OTHER): Payer: Self-pay

## 2019-04-01 ENCOUNTER — Telehealth: Payer: Self-pay

## 2019-04-01 NOTE — Telephone Encounter (Signed)
Patient mother Jackelyn Poling) called with patient on the phone stating that he had COVID-19 testing done on 03/27/2019 and waiting for results. Patient states that he is having the following symptoms since last week: fever (100.0-101.0), chills, body aches, chest pain, bad cough, muscle aches. Patient denies any SOB at this moment and states he stays with is 24 year old grandmother. I advised patient to try and isolate in his room at much as possible and wear a mask when out of the room in the same area that his grandmother uses. Patient mother wanted to know if he should go back to the emergency room or should he just wait. I advised patient if he states having SOB to go to the ER. FYI

## 2019-04-02 LAB — NOVEL CORONAVIRUS, NAA: SARS-CoV-2, NAA: NOT DETECTED

## 2019-04-03 ENCOUNTER — Inpatient Hospital Stay
Admission: EM | Admit: 2019-04-03 | Discharge: 2019-04-08 | DRG: 193 | Disposition: A | Discharge status: Home / Self Care | Payer: Federal, State, Local not specified - PPO | Attending: Internal Medicine | Admitting: Internal Medicine

## 2019-04-03 ENCOUNTER — Emergency Department: Payer: Federal, State, Local not specified - PPO

## 2019-04-03 ENCOUNTER — Telehealth: Payer: Self-pay

## 2019-04-03 ENCOUNTER — Other Ambulatory Visit: Payer: Self-pay

## 2019-04-03 ENCOUNTER — Encounter: Payer: Self-pay | Admitting: Emergency Medicine

## 2019-04-03 DIAGNOSIS — R0602 Shortness of breath: Secondary | ICD-10-CM | POA: Diagnosis not present

## 2019-04-03 DIAGNOSIS — J189 Pneumonia, unspecified organism: Secondary | ICD-10-CM | POA: Diagnosis not present

## 2019-04-03 DIAGNOSIS — J9601 Acute respiratory failure with hypoxia: Secondary | ICD-10-CM | POA: Diagnosis not present

## 2019-04-03 DIAGNOSIS — R509 Fever, unspecified: Secondary | ICD-10-CM | POA: Diagnosis not present

## 2019-04-03 DIAGNOSIS — Z87891 Personal history of nicotine dependence: Secondary | ICD-10-CM

## 2019-04-03 DIAGNOSIS — Z79899 Other long term (current) drug therapy: Secondary | ICD-10-CM

## 2019-04-03 DIAGNOSIS — Z209 Contact with and (suspected) exposure to unspecified communicable disease: Secondary | ICD-10-CM | POA: Diagnosis not present

## 2019-04-03 DIAGNOSIS — F329 Major depressive disorder, single episode, unspecified: Secondary | ICD-10-CM | POA: Diagnosis present

## 2019-04-03 DIAGNOSIS — T43595A Adverse effect of other antipsychotics and neuroleptics, initial encounter: Secondary | ICD-10-CM | POA: Diagnosis not present

## 2019-04-03 DIAGNOSIS — F200 Paranoid schizophrenia: Secondary | ICD-10-CM | POA: Diagnosis present

## 2019-04-03 DIAGNOSIS — E876 Hypokalemia: Secondary | ICD-10-CM | POA: Diagnosis not present

## 2019-04-03 DIAGNOSIS — J181 Lobar pneumonia, unspecified organism: Principal | ICD-10-CM | POA: Diagnosis present

## 2019-04-03 DIAGNOSIS — R0902 Hypoxemia: Secondary | ICD-10-CM | POA: Diagnosis not present

## 2019-04-03 DIAGNOSIS — R05 Cough: Secondary | ICD-10-CM | POA: Diagnosis not present

## 2019-04-03 DIAGNOSIS — F209 Schizophrenia, unspecified: Secondary | ICD-10-CM | POA: Diagnosis not present

## 2019-04-03 DIAGNOSIS — Z20828 Contact with and (suspected) exposure to other viral communicable diseases: Secondary | ICD-10-CM | POA: Diagnosis present

## 2019-04-03 DIAGNOSIS — D702 Other drug-induced agranulocytosis: Secondary | ICD-10-CM | POA: Diagnosis not present

## 2019-04-03 DIAGNOSIS — R5381 Other malaise: Secondary | ICD-10-CM | POA: Diagnosis not present

## 2019-04-03 LAB — COMPREHENSIVE METABOLIC PANEL
ALT: 20 U/L (ref 0–44)
AST: 42 U/L — ABNORMAL HIGH (ref 15–41)
Albumin: 3.9 g/dL (ref 3.5–5.0)
Alkaline Phosphatase: 43 U/L (ref 38–126)
Anion gap: 9 (ref 5–15)
BUN: 10 mg/dL (ref 6–20)
CO2: 26 mmol/L (ref 22–32)
Calcium: 8 mg/dL — ABNORMAL LOW (ref 8.9–10.3)
Chloride: 104 mmol/L (ref 98–111)
Creatinine, Ser: 1.26 mg/dL — ABNORMAL HIGH (ref 0.61–1.24)
GFR calc Af Amer: >60 mL/min (ref >60)
GFR calc non Af Amer: >60 mL/min (ref >60)
Glucose, Bld: 103 mg/dL — ABNORMAL HIGH (ref 70–99)
Potassium: 3.4 mmol/L — ABNORMAL LOW (ref 3.5–5.1)
Sodium: 139 mmol/L (ref 135–145)
Total Bilirubin: 0.6 mg/dL (ref 0.3–1.2)
Total Protein: 6.5 g/dL (ref 6.5–8.1)

## 2019-04-03 LAB — CBC WITH DIFFERENTIAL/PLATELET
Abs Immature Granulocytes: 0.07 10*3/uL (ref 0.00–0.07)
Basophils Absolute: 0 10*3/uL (ref 0.0–0.1)
Basophils Relative: 0 %
Eosinophils Absolute: 0 10*3/uL (ref 0.0–0.5)
Eosinophils Relative: 0 %
HCT: 43.4 % (ref 39.0–52.0)
Hemoglobin: 14.4 g/dL (ref 13.0–17.0)
Immature Granulocytes: 2 %
Lymphocytes Relative: 17 %
Lymphs Abs: 0.6 10*3/uL — ABNORMAL LOW (ref 0.7–4.0)
MCH: 28.3 pg (ref 26.0–34.0)
MCHC: 33.2 g/dL (ref 30.0–36.0)
MCV: 85.3 fL (ref 80.0–100.0)
Monocytes Absolute: 0.3 10*3/uL (ref 0.1–1.0)
Monocytes Relative: 7 %
Neutro Abs: 2.7 10*3/uL (ref 1.7–7.7)
Neutrophils Relative %: 74 %
Platelets: 200 10*3/uL (ref 150–400)
RBC: 5.09 MIL/uL (ref 4.22–5.81)
RDW: 12.1 % (ref 11.5–15.5)
WBC: 3.6 10*3/uL — ABNORMAL LOW (ref 4.0–10.5)
nRBC: 0 % (ref 0.0–0.2)

## 2019-04-03 LAB — SARS CORONAVIRUS 2 BY RT PCR (HOSPITAL ORDER, PERFORMED IN ~~LOC~~ HOSPITAL LAB): SARS Coronavirus 2: NEGATIVE

## 2019-04-03 LAB — PROCALCITONIN: Procalcitonin: <0.1 ng/mL

## 2019-04-03 LAB — LACTIC ACID, PLASMA: Lactic Acid, Venous: 1.1 mmol/L (ref 0.5–1.9)

## 2019-04-03 MED ORDER — ACETAMINOPHEN 500 MG PO TABS
1000.0000 mg | ORAL_TABLET | Freq: Once | ORAL | Status: AC
  Administered 2019-04-03: 1000 mg via ORAL
  Filled 2019-04-03: qty 2

## 2019-04-03 MED ORDER — SODIUM CHLORIDE 0.9 % IV SOLN
500.0000 mg | Freq: Once | INTRAVENOUS | Status: AC
  Administered 2019-04-03: 500 mg via INTRAVENOUS
  Filled 2019-04-03: qty 500

## 2019-04-03 MED ORDER — SODIUM CHLORIDE 0.9 % IV SOLN
2.0000 g | INTRAVENOUS | Status: DC
  Administered 2019-04-04: 2 g via INTRAVENOUS
  Filled 2019-04-03: qty 20

## 2019-04-03 MED ORDER — AZITHROMYCIN 500 MG PO TABS
250.0000 mg | ORAL_TABLET | Freq: Every day | ORAL | Status: AC
  Administered 2019-04-04 – 2019-04-07 (×4): 250 mg via ORAL
  Filled 2019-04-03 (×3): qty 1
  Filled 2019-04-03 (×2): qty 0.5
  Filled 2019-04-03: qty 1
  Filled 2019-04-03 (×2): qty 0.5

## 2019-04-03 MED ORDER — IBUPROFEN 400 MG PO TABS
600.0000 mg | ORAL_TABLET | Freq: Four times a day (QID) | ORAL | Status: DC | PRN
  Administered 2019-04-04 – 2019-04-05 (×3): 600 mg via ORAL
  Filled 2019-04-03 (×3): qty 2

## 2019-04-03 MED ORDER — IPRATROPIUM-ALBUTEROL 20-100 MCG/ACT IN AERS
1.0000 | INHALATION_SPRAY | Freq: Four times a day (QID) | RESPIRATORY_TRACT | Status: DC
  Administered 2019-04-03 – 2019-04-05 (×7): 1 via RESPIRATORY_TRACT
  Filled 2019-04-03: qty 4

## 2019-04-03 MED ORDER — IOHEXOL 350 MG/ML SOLN
75.0000 mL | Freq: Once | INTRAVENOUS | Status: AC | PRN
  Administered 2019-04-03: 75 mL via INTRAVENOUS
  Filled 2019-04-03: qty 75

## 2019-04-03 MED ORDER — ONDANSETRON HCL 4 MG PO TABS: 4.0000 mg | ORAL_TABLET | Freq: Four times a day (QID) | ORAL | Status: DC | PRN

## 2019-04-03 MED ORDER — OLANZAPINE 5 MG PO TABS
5.0000 mg | ORAL_TABLET | Freq: Every day | ORAL | Status: DC
  Administered 2019-04-04 – 2019-04-07 (×4): 5 mg via ORAL
  Filled 2019-04-03 (×6): qty 1

## 2019-04-03 MED ORDER — ACETAMINOPHEN 650 MG RE SUPP: 650.0000 mg | Freq: Four times a day (QID) | RECTAL | Status: DC | PRN

## 2019-04-03 MED ORDER — ONDANSETRON HCL 4 MG/2ML IJ SOLN: 4.0000 mg | Freq: Four times a day (QID) | INTRAMUSCULAR | Status: DC | PRN

## 2019-04-03 MED ORDER — ACETAMINOPHEN 325 MG PO TABS
650.0000 mg | ORAL_TABLET | Freq: Four times a day (QID) | ORAL | Status: DC | PRN
  Administered 2019-04-03 – 2019-04-05 (×3): 650 mg via ORAL
  Filled 2019-04-03 (×3): qty 2

## 2019-04-03 MED ORDER — SODIUM CHLORIDE 0.9 % IV SOLN
1.0000 g | Freq: Once | INTRAVENOUS | Status: AC
  Administered 2019-04-03: 1 g via INTRAVENOUS
  Filled 2019-04-03: qty 10

## 2019-04-03 MED ORDER — SODIUM CHLORIDE 0.9 % IV SOLN
INTRAVENOUS | Status: DC
  Administered 2019-04-03 – 2019-04-04 (×2): via INTRAVENOUS

## 2019-04-03 MED ORDER — IPRATROPIUM-ALBUTEROL 0.5-2.5 (3) MG/3ML IN SOLN
3.0000 mL | Freq: Four times a day (QID) | RESPIRATORY_TRACT | Status: DC
  Filled 2019-04-03: qty 3

## 2019-04-03 MED ORDER — BUTALBITAL-APAP-CAFFEINE 50-325-40 MG PO TABS
1.0000 | ORAL_TABLET | Freq: Four times a day (QID) | ORAL | Status: DC | PRN
  Administered 2019-04-03 – 2019-04-07 (×3): 1 via ORAL
  Filled 2019-04-03 (×3): qty 1

## 2019-04-03 MED ORDER — ENOXAPARIN SODIUM 40 MG/0.4ML ~~LOC~~ SOLN
40.0000 mg | SUBCUTANEOUS | Status: DC
  Administered 2019-04-03 – 2019-04-07 (×5): 40 mg via SUBCUTANEOUS
  Filled 2019-04-03 (×5): qty 0.4

## 2019-04-03 NOTE — ED Notes (Signed)
Pulse ox dropped to 89% with ambulation in room  Provider aware

## 2019-04-03 NOTE — ED Triage Notes (Signed)
Brought by ems.  Fever, sob.   Same sx last week with negative covid test.  His doctor wants him tested again.  Febrile.

## 2019-04-03 NOTE — H&P (Signed)
[place] [facility] [place] NAME: Michael Vega    MR#:  [id number]  DATE OF BIRTH:  1996-07-21  DATE OF ADMISSION:  04/03/2019  PRIMARY CARE PHYSICIAN: Trinna Post, PA-C   REQUESTING/REFERRING PHYSICIAN: Dr Carrie Mew  CHIEF COMPLAINT:   Chief Complaint  Patient presents with  . Fever  . Shortness of Breath    HISTORY OF PRESENT ILLNESS:  Michael Vega  is a 23 y.o. male patient coming in with fever going on since last Tuesday.  He has not been feeling well.  He has been having some cold chills and some muscle aches.  Now he is developed shortness of breath.  He has had a dry cough.  Unable to bring up any phlegm.  He had a COVID test as outpatient that was negative and another Test here negative.hospitalist services were contacted for further evaluation.  PAST MEDICAL HISTORY:   Past Medical History:  Diagnosis Date  . Depression   . Schizophrenia (Combine)   . Schizophrenia, paranoid ([place])     PAST SURGICAL HISTORY:   Past Surgical History:  Procedure Laterality Date  . WISDOM TOOTH EXTRACTION      SOCIAL HISTORY:   Social History   Tobacco Use  . Smoking status: Never Smoker  . Smokeless tobacco: Never Used  Substance Use Topics  . Alcohol use: No    Alcohol/week: 0.0 standard drinks    FAMILY HISTORY:   Family History  Problem Relation Age of Onset  . Healthy Mother   . Healthy Father   . Cirrhosis Paternal Grandfather   . ADD / ADHD Neg Hx   . Alcohol abuse Neg Hx   . Anxiety disorder Neg Hx   . Bipolar disorder Neg Hx   . Depression Neg Hx   . Schizophrenia Neg Hx     DRUG ALLERGIES:  No Known Allergies  REVIEW OF SYSTEMS:  CONSTITUTIONAL: No fever, fatigue or weakness.  EYES: No blurred or double vision.  EARS, NOSE, AND THROAT: No tinnitus or ear pain. No sore throat RESPIRATORY: No cough, shortness of breath, wheezing or hemoptysis.  CARDIOVASCULAR: No chest pain, orthopnea,  edema.  GASTROINTESTINAL: No nausea, vomiting, diarrhea or abdominal pain. No blood in bowel movements GENITOURINARY: No dysuria, hematuria.  ENDOCRINE: No polyuria, nocturia,  HEMATOLOGY: No anemia, easy bruising or bleeding SKIN: No rash or lesion. MUSCULOSKELETAL: No joint pain or arthritis.   NEUROLOGIC: No tingling, numbness, weakness.  PSYCHIATRY: No anxiety or depression.   MEDICATIONS AT HOME:   Prior to Admission medications   Medication Sig Start Date End Date Taking? Authorizing Provider  OLANZapine (ZYPREXA) 5 MG tablet Take 1 tablet (5 mg total) by mouth daily. 10/10/18  Yes Charlcie Cradle, MD      VITAL SIGNS:  Blood pressure 131/74, pulse (!) 112, temperature 98.8 F (37.1 C), temperature source Oral, resp. rate 16, SpO2 94 %.  PHYSICAL EXAMINATION:  GENERAL:  23 y.o.-year-old patient lying in the bed with no acute distress.  EYES: Pupils equal, round, reactive to light and accommodation. No scleral icterus. Extraocular muscles intact.  HEENT: Head atraumatic, normocephalic. Oropharynx and nasopharynx clear.  NECK:  Supple, no jugular venous distention. No thyroid enlargement, no tenderness.  LUNGS: Normal breath sounds bilaterally, no wheezing, rales,rhonchi or crepitation. No use of accessory muscles of respiration.  CARDIOVASCULAR: S1, S2 normal. No murmurs, rubs, or gallops.  ABDOMEN: Soft, nontender, nondistended. Bowel sounds present. No organomegaly or mass.  EXTREMITIES: No pedal edema,  cyanosis, or clubbing.  NEUROLOGIC: Cranial nerves II through XII are intact. Muscle strength 5/5 in all extremities. Sensation intact. Gait not checked.  PSYCHIATRIC: The patient is alert and oriented x 3.  SKIN: No rash, lesion, or ulcer.   LABORATORY PANEL:   CBC Recent Labs  Lab 04/03/19 1307  WBC 3.6*  HGB 14.4  HCT 43.4  PLT 200   ------------------------------------------------------------------------------------------------------------------  Chemistries   Recent Labs  Lab 04/03/19 1307  NA 139  K 3.4*  CL 104  CO2 26  GLUCOSE 103*  BUN 10  CREATININE 1.26*  CALCIUM 8.0*  AST 42*  ALT 20  ALKPHOS 43  BILITOT 0.6   ------------------------------------------------------------------------------------------------------------------    RADIOLOGY:  Ct Angio Chest Pe W And/or Wo Contrast  Result Date: 04/03/2019 CLINICAL DATA:  Fever, cough, body aches EXAM: CT ANGIOGRAPHY CHEST WITH CONTRAST TECHNIQUE: Multidetector CT imaging of the chest was performed using the standard protocol during bolus administration of intravenous contrast. Multiplanar CT image reconstructions and MIPs were obtained to evaluate the vascular anatomy. CONTRAST:  75mL OMNIPAQUE IOHEXOL 350 MG/ML SOLN COMPARISON:  None. FINDINGS: Cardiovascular: Satisfactory opacification of the pulmonary arteries to the segmental level. No evidence of pulmonary embolism. Normal heart size. No pericardial effusion. Mediastinum/Nodes: No enlarged mediastinal, hilar, or axillary lymph nodes. Thyroid gland, trachea, and esophagus demonstrate no significant findings. Lungs/Pleura: Patchy areas of airspace disease in the right upper lobe, left upper lobe, right middle lobe and bilateral lower lobes most concerning for multilobar pneumonia. No pleural effusion or pneumothorax. Upper Abdomen: No acute abnormality. Small hiatal hernia. Musculoskeletal: No acute osseous abnormality. No aggressive osseous lesion. Review of the MIP images confirms the above findings. IMPRESSION: 1. No evidence pulmonary embolus. 2. Multilobar pneumonia. Electronically Signed   By: Elige KoHetal  Patel   On: 04/03/2019 14:33   Dg Chest Portable 1 View  Result Date: 04/03/2019 CLINICAL DATA:  Cough and fever EXAM: PORTABLE CHEST 1 VIEW COMPARISON:  Report of prior chest radiograph December 26, 2010 available; images from that study cannot be retrieved. FINDINGS: There is no edema or consolidation. Heart is upper normal in size with  pulmonary vascularity normal. No adenopathy. No bone lesions. IMPRESSION: No edema or consolidation.  Heart upper normal in size. Electronically Signed   By: Bretta BangWilliam  Woodruff III M.D.   On: 04/03/2019 10:18     IMPRESSION AND PLAN:   1.  Multifocal pneumonia with fever.  Start Rocephin and Zithromax.  Would like to see temperature come down prior to disposition..  2 COVID test negative. 2.  Schizophrenia and depression on Zyprexa 3.  Headache will give Tylenol and if that does not help can try Fioricet    All the records are reviewed and case discussed with ED provider. Management plans discussed with the patient, and he is in agreement.  CODE STATUS: Full code  TOTAL TIME TAKING CARE OF THIS PATIENT: 45 minutes.    Alford Highlandichard Thadd Apuzzo M.D on 04/03/2019 at 4:46 PM  Between 7am to 6pm - Pager - [phone]  After 6pm call admission pager [phone]  [facility] Office  [phone]  CC: Primary care physician; Trey SailorsPollak, Adriana M, PA-C

## 2019-04-03 NOTE — Progress Notes (Signed)
Pt has no septic protocol for the patients. Called [facility] and talked to Dr. Earleen Newport and states will place the order. Will continue to monitor.

## 2019-04-03 NOTE — ED Notes (Signed)
See triage note  Presents with fever,cough and body aches   States sx's started about 7 10 days   Was tested for COVID last week but it was negative    States he is still having sx's  Febrile on arrival

## 2019-04-03 NOTE — Telephone Encounter (Signed)
Patients mother contacted office stating that patient Covid-19 test ame back negative yesterday but for the past 124hrs has complained of worsening symptoms of cough, chest tightness and shortness of breath. Patients mother stated she had concerns because patient lives with a elderly family member of the age of 33. Spoke with patients PCP who advised that patient seek medical treatment at nearest ED. Mother was advised and understood. KW

## 2019-04-03 NOTE — Telephone Encounter (Signed)
Noted and agreed, if he has SOB he should go to the ER for evaluation.

## 2019-04-03 NOTE — ED Provider Notes (Signed)
[facility] Emergency Department Provider Note  ____________________________________________   First MD Initiated Contact with Patient 04/03/19 [phone]     (approximate)  I have reviewed the triage vital signs and the nursing notes.   HISTORY  Chief Complaint Fever and Shortness of Breath   HPI Michael Vega is a 23 y.o. male presents to the ED with history of cough and fever last week.  He states that he went to a drive-through testing center for a COVID test per his doctor which was reported as negative.  He continues to have symptoms of cough and fever.  Today he complains of shortness of breath especially on exertion.  He denies being a smoker presently but has smoked in the past.  Currently rates his discomfort as a 10/10.     Past Medical History:  Diagnosis Date  . Depression   . Schizophrenia ([place])   . Schizophrenia, paranoid [facility])     Patient Active Problem List   Diagnosis Date Noted  . Pneumonia 04/03/2019  . MDD (major depressive disorder), recurrent, in full remission ([place]) 03/09/2015  . Paranoid schizophrenia ([place]) 08/04/2014    Past Surgical History:  Procedure Laterality Date  . WISDOM TOOTH EXTRACTION      Prior to Admission medications   Medication Sig Start Date End Date Taking? Authorizing Provider  OLANZapine (ZYPREXA) 5 MG tablet Take 1 tablet (5 mg total) by mouth daily. 10/10/18   Charlcie Cradle, MD    Allergies Patient has no known allergies.  Family History  Problem Relation Age of Onset  . ADD / ADHD Neg Hx   . Alcohol abuse Neg Hx   . Anxiety disorder Neg Hx   . Bipolar disorder Neg Hx   . Depression Neg Hx   . Schizophrenia Neg Hx     Social History Social History   Tobacco Use  . Smoking status: Never Smoker  . Smokeless tobacco: Never Used  Substance Use Topics  . Alcohol use: No    Alcohol/week: 0.0 standard drinks  . Drug use: No    Review of Systems Constitutional: No fever/chills Eyes:  No visual changes. ENT: No congestion, drainage. Cardiovascular: Denies chest pain. Respiratory: Positive for shortness of breath.  Positive for coughing. Gastrointestinal: No abdominal pain.  No nausea, no vomiting.  No diarrhea.   Musculoskeletal: Positive for muscle aches. Skin: Negative for rash. Neurological: Negative for headaches, focal weakness or numbness. ____________________________________________   PHYSICAL EXAM:  VITAL SIGNS: ED Triage Vitals  Enc Vitals Group     BP 04/03/19 0952 113/70     Pulse Rate 04/03/19 0952 (!) 114     Resp 04/03/19 0952 16     Temp 04/03/19 0952 (!) 101.6 F (38.7 C)     Temp Source 04/03/19 0952 Oral     SpO2 04/03/19 0952 93 %     Weight --      Height --      Head Circumference --      Peak Flow --      Pain Score 04/03/19 0947 10     Pain Loc --      Pain Edu? --      Excl. in [place]? --    Constitutional: Alert and oriented. Well appearing and in no acute distress. Eyes: Conjunctivae are normal.  Head: Atraumatic. Nose: No congestion/rhinnorhea. Mouth/Throat: Mucous membranes are moist.  Oropharynx non-erythematous. Neck: No stridor.   Cardiovascular: Normal rate, regular rhythm. Grossly normal heart sounds.  Good  peripheral circulation. Respiratory: Normal respiratory effort.  No retractions. Lungs CTAB. Gastrointestinal: Soft and nontender. No distention.  Musculoskeletal: Moves upper and lower extremities with any difficulty.  Normal gait was noted. Neurologic:  Normal speech and language. No gross focal neurologic deficits are appreciated. No gait instability. Skin:  Skin is warm, dry and intact. No rash noted. Psychiatric: Mood and affect are normal. Speech and behavior are normal.  ____________________________________________   LABS (all labs ordered are listed, but only abnormal results are displayed)  Labs Reviewed  CBC WITH DIFFERENTIAL/PLATELET - Abnormal; Notable for the following components:      Result Value    WBC 3.6 (*)    Lymphs Abs 0.6 (*)    All other components within normal limits  COMPREHENSIVE METABOLIC PANEL - Abnormal; Notable for the following components:   Potassium 3.4 (*)    Glucose, Bld 103 (*)    Creatinine, Ser 1.26 (*)    Calcium 8.0 (*)    AST 42 (*)    All other components within normal limits  SARS CORONAVIRUS 2 (HOSPITAL ORDER, PERFORMED IN [facility])  NOVEL CORONAVIRUS, NAA (HOSPITAL ORDER, SEND-OUT TO REF LAB)  PROCALCITONIN   __________________________________________  RADIOLOGY  Official radiology report(s): Ct Angio Chest Pe W And/or Wo Contrast  Result Date: 04/03/2019 CLINICAL DATA:  Fever, cough, body aches EXAM: CT ANGIOGRAPHY CHEST WITH CONTRAST TECHNIQUE: Multidetector CT imaging of the chest was performed using the standard protocol during bolus administration of intravenous contrast. Multiplanar CT image reconstructions and MIPs were obtained to evaluate the vascular anatomy. CONTRAST:  75mL OMNIPAQUE IOHEXOL 350 MG/ML SOLN COMPARISON:  None. FINDINGS: Cardiovascular: Satisfactory opacification of the pulmonary arteries to the segmental level. No evidence of pulmonary embolism. Normal heart size. No pericardial effusion. Mediastinum/Nodes: No enlarged mediastinal, hilar, or axillary lymph nodes. Thyroid gland, trachea, and esophagus demonstrate no significant findings. Lungs/Pleura: Patchy areas of airspace disease in the right upper lobe, left upper lobe, right middle lobe and bilateral lower lobes most concerning for multilobar pneumonia. No pleural effusion or pneumothorax. Upper Abdomen: No acute abnormality. Small hiatal hernia. Musculoskeletal: No acute osseous abnormality. No aggressive osseous lesion. Review of the MIP images confirms the above findings. IMPRESSION: 1. No evidence pulmonary embolus. 2. Multilobar pneumonia. Electronically Signed   By: Elige KoHetal  Patel   On: 04/03/2019 14:33   Dg Chest Portable 1 View  Result Date: 04/03/2019  CLINICAL DATA:  Cough and fever EXAM: PORTABLE CHEST 1 VIEW COMPARISON:  Report of prior chest radiograph December 26, 2010 available; images from that study cannot be retrieved. FINDINGS: There is no edema or consolidation. Heart is upper normal in size with pulmonary vascularity normal. No adenopathy. No bone lesions. IMPRESSION: No edema or consolidation.  Heart upper normal in size. Electronically Signed   By: Bretta BangWilliam  Woodruff III M.D.   On: 04/03/2019 10:18    ____________________________________________   PROCEDURES  Procedure(s) performed (including Critical Care):  Procedures   ____________________________________________   INITIAL IMPRESSION / ASSESSMENT AND PLAN / ED COURSE  As part of my medical decision making, I reviewed the following data within the electronic MEDICAL RECORD NUMBER Notes from prior ED visits and [place] Controlled Substance Database  23 year old male presents to the ED with complaint of fever, cough and body aches for approximately 7 days ago.  He was sent to a drive and COVID testing site by his doctor at which time it was reported as negative.  He continues to have symptoms and is febrile this  morning.  His doctor sent him to the ED for another test.  Patient is non-smoker and states that he is short of breath with exertion.  Chest x-ray is negative.  O2 sat in triage was 94 and was observed to drop as low as 89 while patient was walking around the room.  Rapid COVID testing in the hospital was reported as negative.  A sent out test was done.  CT chest shows multi lobar pneumonia.  Patient was made aware and that he will be admitted for this.  ____________________________________________   FINAL CLINICAL IMPRESSION(S) / ED DIAGNOSES  Final diagnoses:  Hypoxia  Community acquired pneumonia, unspecified laterality     ED Discharge Orders    None       Note:  This document was prepared using Dragon voice recognition software and may include unintentional  dictation errors.    Tommi RumpsSummers, Berdell Nevitt L, PA-C 04/03/19 1602    Sharman CheekStafford, Phillip, MD 04/09/19 1555

## 2019-04-03 NOTE — Plan of Care (Signed)
Newly admitted to cardiac telemetry unit for pneumonia.   Problem: Education: Goal: Knowledge of General Education information will improve Description: Including pain rating scale, medication(s)/side effects and non-pharmacologic comfort measures Outcome: Not Progressing   Problem: Health Behavior/Discharge Planning: Goal: Ability to manage health-related needs will improve Outcome: Not Progressing   Problem: Clinical Measurements: Goal: Ability to maintain clinical measurements within normal limits will improve Outcome: Not Progressing Goal: Will remain free from infection Outcome: Not Progressing Goal: Diagnostic test results will improve Outcome: Not Progressing Goal: Respiratory complications will improve Outcome: Not Progressing Goal: Cardiovascular complication will be avoided Outcome: Not Progressing   Problem: Activity: Goal: Risk for activity intolerance will decrease Outcome: Not Progressing   Problem: Nutrition: Goal: Adequate nutrition will be maintained Outcome: Not Progressing   Problem: Coping: Goal: Level of anxiety will decrease Outcome: Not Progressing   Problem: Elimination: Goal: Will not experience complications related to bowel motility Outcome: Not Progressing Goal: Will not experience complications related to urinary retention Outcome: Not Progressing   Problem: Pain Managment: Goal: General experience of comfort will improve Outcome: Not Progressing   Problem: Safety: Goal: Ability to remain free from injury will improve Outcome: Not Progressing   Problem: Skin Integrity: Goal: Risk for impaired skin integrity will decrease Outcome: Not Progressing   Problem: Activity: Goal: Ability to tolerate increased activity will improve Outcome: Not Progressing   Problem: Clinical Measurements: Goal: Ability to maintain a body temperature in the normal range will improve Outcome: Not Progressing   Problem: Respiratory: Goal: Ability to  maintain adequate ventilation will improve Outcome: Not Progressing Goal: Ability to maintain a clear airway will improve Outcome: Not Progressing

## 2019-04-03 NOTE — Progress Notes (Signed)
Patient ID: Michael Vega, male   DOB: [birth year], 23 y.o.   MRN: [id number]  Updated mother on the phone. That I am treating pneumonia.  Will give antibiotics.  Would like to have his temperature curve come down prior to discharge and him feeling a little bit better.  Dr Loletha Grayer

## 2019-04-03 NOTE — ED Notes (Addendum)
Pt requesting Excedrin or other med besides Tylenol for HA. Will notify provider. Pt's mother briefly updated per pt request.

## 2019-04-04 DIAGNOSIS — F209 Schizophrenia, unspecified: Secondary | ICD-10-CM | POA: Diagnosis not present

## 2019-04-04 DIAGNOSIS — J189 Pneumonia, unspecified organism: Secondary | ICD-10-CM | POA: Diagnosis not present

## 2019-04-04 LAB — CBC
HCT: 41 % (ref 39.0–52.0)
Hemoglobin: 13.8 g/dL (ref 13.0–17.0)
MCH: 28.4 pg (ref 26.0–34.0)
MCHC: 33.7 g/dL (ref 30.0–36.0)
MCV: 84.4 fL (ref 80.0–100.0)
Platelets: 196 10*3/uL (ref 150–400)
RBC: 4.86 MIL/uL (ref 4.22–5.81)
RDW: 12.2 % (ref 11.5–15.5)
WBC: 3.6 10*3/uL — ABNORMAL LOW (ref 4.0–10.5)
nRBC: 0 % (ref 0.0–0.2)

## 2019-04-04 LAB — LACTIC ACID, PLASMA: Lactic Acid, Venous: 0.8 mmol/L (ref 0.5–1.9)

## 2019-04-04 LAB — BASIC METABOLIC PANEL
Anion gap: 8 (ref 5–15)
BUN: 9 mg/dL (ref 6–20)
CO2: 24 mmol/L (ref 22–32)
Calcium: 7.9 mg/dL — ABNORMAL LOW (ref 8.9–10.3)
Chloride: 109 mmol/L (ref 98–111)
Creatinine, Ser: 0.96 mg/dL (ref 0.61–1.24)
GFR calc Af Amer: >60 mL/min (ref >60)
GFR calc non Af Amer: >60 mL/min (ref >60)
Glucose, Bld: 97 mg/dL (ref 70–99)
Potassium: 3.6 mmol/L (ref 3.5–5.1)
Sodium: 141 mmol/L (ref 135–145)

## 2019-04-04 LAB — NOVEL CORONAVIRUS, NAA (HOSP ORDER, SEND-OUT TO REF LAB; TAT 18-24 HRS): SARS-CoV-2, NAA: NOT DETECTED

## 2019-04-04 MED ORDER — ORAL CARE MOUTH RINSE
15.0000 mL | Freq: Two times a day (BID) | OROMUCOSAL | Status: DC
  Administered 2019-04-05 – 2019-04-07 (×3): 15 mL via OROMUCOSAL

## 2019-04-04 MED ORDER — CEFUROXIME AXETIL 500 MG PO TABS
500.0000 mg | ORAL_TABLET | Freq: Two times a day (BID) | ORAL | Status: DC
  Administered 2019-04-05 – 2019-04-06 (×3): 500 mg via ORAL
  Filled 2019-04-04 (×4): qty 1

## 2019-04-04 NOTE — Plan of Care (Signed)
  Problem: Education: Goal: Knowledge of General Education information will improve Description: Including pain rating scale, medication(s)/side effects and non-pharmacologic comfort measures Outcome: Progressing   Problem: Health Behavior/Discharge Planning: Goal: Ability to manage health-related needs will improve Outcome: Not Progressing Note: Patient still exhibiting tachypnea and with a low grade fever most recently (100.2). Patient remains in Coronavirus R/O pending 2nd test result from yesterday. Will continue to monitor respiratory status. Wenda Low [facility]

## 2019-04-04 NOTE — Progress Notes (Signed)
Patient stated via phone that he didn't need help ambulating to the room BR at this time w/ IV pole. Patient remains a low fall risk. Will continue to monitor. Wenda Low [facility]

## 2019-04-04 NOTE — Plan of Care (Signed)
  Problem: Education: Goal: Knowledge of General Education information will improve Description: Including pain rating scale, medication(s)/side effects and non-pharmacologic comfort measures Outcome: Progressing   Problem: Coping: Goal: Level of anxiety will decrease Outcome: Progressing   Problem: Activity: Goal: Ability to tolerate increased activity will improve Outcome: Progressing

## 2019-04-04 NOTE — Progress Notes (Signed)
[place] at [place] NAME: Michael Vega    MR#:  [id number]  DATE OF BIRTH:  1995-12-23  SUBJECTIVE:   Patient still weak and feels short of breath  REVIEW OF SYSTEMS:    Review of Systems  Constitutional: Negative for fever, chills weight loss HENT: Negative for ear pain, nosebleeds, congestion, facial swelling, rhinorrhea, neck pain, neck stiffness and ear discharge.   Respiratory: ++ for cough, shortness of breath, nowheezing  Cardiovascular: Negative for chest pain, palpitations and leg swelling.  Gastrointestinal: Negative for heartburn, abdominal pain, vomiting, diarrhea or consitpation Genitourinary: Negative for dysuria, urgency, frequency, hematuria Musculoskeletal: Negative for back pain or joint pain Neurological: Negative for dizziness, seizures, syncope, focal weakness,  numbness and headaches.  Hematological: Does not bruise/bleed easily.  Psychiatric/Behavioral: Negative for hallucinations, confusion, dysphoric mood    Tolerating Diet: yes      DRUG ALLERGIES:  No Known Allergies  VITALS:  Blood pressure 117/76, pulse (!) 104, temperature 99.4 F (37.4 C), temperature source Oral, resp. rate (!) 24, height 5\' 9"  (1.753 m), weight 107.9 kg, SpO2 95 %.  PHYSICAL EXAMINATION:  Constitutional: Appears well-developed and well-nourished. No distress. HENT: Normocephalic. Marland Kitchen Oropharynx is clear and moist.  Eyes: Conjunctivae and EOM are normal. PERRLA, no scleral icterus.  Neck: Normal ROM. Neck supple. No JVD. No tracheal deviation. CVS: RRR, S1/S2 +, no murmurs, no gallops, no carotid bruit.  Pulmonary: Effort and breath sounds normal, no stridor, rhonchi, wheezes, rales.  Abdominal: Soft. BS +,  no distension, tenderness, rebound or guarding.  Musculoskeletal: Normal range of motion. No edema and no tenderness.  Neuro: Alert. CN 2-12 grossly intact. No focal deficits. Skin: Skin is warm and dry. No rash  noted. Psychiatric: Normal mood and affect.      LABORATORY PANEL:   CBC Recent Labs  Lab 04/04/19 0511  WBC 3.6*  HGB 13.8  HCT 41.0  PLT 196   ------------------------------------------------------------------------------------------------------------------  Chemistries  Recent Labs  Lab 04/03/19 1307 04/04/19 0511  NA 139 141  K 3.4* 3.6  CL 104 109  CO2 26 24  GLUCOSE 103* 97  BUN 10 9  CREATININE 1.26* 0.96  CALCIUM 8.0* 7.9*  AST 42*  --   ALT 20  --   ALKPHOS 43  --   BILITOT 0.6  --    ------------------------------------------------------------------------------------------------------------------  Cardiac Enzymes No results for input(s): TROPONINI in the last 168 hours. ------------------------------------------------------------------------------------------------------------------  RADIOLOGY:  Ct Angio Chest Pe W And/or Wo Contrast  Result Date: 04/03/2019 CLINICAL DATA:  Fever, cough, body aches EXAM: CT ANGIOGRAPHY CHEST WITH CONTRAST TECHNIQUE: Multidetector CT imaging of the chest was performed using the standard protocol during bolus administration of intravenous contrast. Multiplanar CT image reconstructions and MIPs were obtained to evaluate the vascular anatomy. CONTRAST:  54mL OMNIPAQUE IOHEXOL 350 MG/ML SOLN COMPARISON:  None. FINDINGS: Cardiovascular: Satisfactory opacification of the pulmonary arteries to the segmental level. No evidence of pulmonary embolism. Normal heart size. No pericardial effusion. Mediastinum/Nodes: No enlarged mediastinal, hilar, or axillary lymph nodes. Thyroid gland, trachea, and esophagus demonstrate no significant findings. Lungs/Pleura: Patchy areas of airspace disease in the right upper lobe, left upper lobe, right middle lobe and bilateral lower lobes most concerning for multilobar pneumonia. No pleural effusion or pneumothorax. Upper Abdomen: No acute abnormality. Small hiatal hernia. Musculoskeletal: No acute  osseous abnormality. No aggressive osseous lesion. Review of the MIP images confirms the above findings. IMPRESSION: 1. No evidence pulmonary embolus. 2. Multilobar pneumonia.  Electronically Signed   By: Elige KoHetal  Patel   On: 04/03/2019 14:33   Dg Chest Portable 1 View  Result Date: 04/03/2019 CLINICAL DATA:  Cough and fever EXAM: PORTABLE CHEST 1 VIEW COMPARISON:  Report of prior chest radiograph December 26, 2010 available; images from that study cannot be retrieved. FINDINGS: There is no edema or consolidation. Heart is upper normal in size with pulmonary vascularity normal. No adenopathy. No bone lesions. IMPRESSION: No edema or consolidation.  Heart upper normal in size. Electronically Signed   By: Bretta BangWilliam  Woodruff III M.D.   On: 04/03/2019 10:18     ASSESSMENT AND PLAN:   23 year old male with history of schizophrenia who presented to the emergency room due to shortness of breath and fever.  1.  Acute hypoxic respiratory failure in the setting of multifocal pneumonia with fever (community-acquired): Continue Rocephin and Zithromax Incentive spirometer ordered COVID testing is negative Wean oxygen to room air CT scan negative for pulmonary emboli Follow-up on final blood cultures 2.  Hypokalemia: Repleted   3.  Schizophrenia: Continue Zyprexa   Management plans discussed with the patient and he is in agreement.  CODE STATUS: full  TOTAL TIME TAKING CARE OF THIS PATIENT: 30 minutes.     POSSIBLE D/C tomorrow, DEPENDING ON CLINICAL CONDITION.   Adrian SaranSital Diondre Pulis M.D on 04/04/2019 at 10:37 AM  Between 7am to 6pm - Pager - [phone] After 6pm go to [email or web address] - password [email or web address] [email or web address]  [facility]  Office  [phone]  CC: Primary care physician; Trey SailorsPollak, Adriana M, PA-C  Note: This dictation was prepared with Dragon dictation along with smaller phrase technology. Any transcriptional errors that result from this process are unintentional.

## 2019-04-05 DIAGNOSIS — F209 Schizophrenia, unspecified: Secondary | ICD-10-CM | POA: Diagnosis not present

## 2019-04-05 DIAGNOSIS — J9601 Acute respiratory failure with hypoxia: Secondary | ICD-10-CM | POA: Diagnosis not present

## 2019-04-05 DIAGNOSIS — J189 Pneumonia, unspecified organism: Secondary | ICD-10-CM | POA: Diagnosis not present

## 2019-04-05 LAB — HIV ANTIBODY (ROUTINE TESTING W REFLEX): HIV Screen 4th Generation wRfx: NONREACTIVE

## 2019-04-05 MED ORDER — ALBUTEROL SULFATE (2.5 MG/3ML) 0.083% IN NEBU
2.5000 mg | INHALATION_SOLUTION | RESPIRATORY_TRACT | Status: DC | PRN
  Administered 2019-04-05 (×3): 2.5 mg via RESPIRATORY_TRACT
  Filled 2019-04-05 (×3): qty 3

## 2019-04-05 MED ORDER — DIPHENHYDRAMINE HCL 25 MG PO CAPS
25.0000 mg | ORAL_CAPSULE | Freq: Every evening | ORAL | Status: DC | PRN
  Administered 2019-04-05: 25 mg via ORAL
  Filled 2019-04-05: qty 1

## 2019-04-05 MED ORDER — GUAIFENESIN-DM 100-10 MG/5ML PO SYRP
5.0000 mL | ORAL_SOLUTION | ORAL | Status: DC | PRN
  Administered 2019-04-05: 5 mL via ORAL
  Filled 2019-04-05: qty 5

## 2019-04-05 NOTE — Progress Notes (Signed)
Spoke with dr. Bridgett Larsson to make aware patient is still requiring 02 at 2.5 remains sob at rest and with excertion. Currently on scheduled inhaler, covid was negative, per md will place orders for scheduled neb treatments. Also made md aware patients mother Jackelyn Poling wants to speak with him regarding patients care

## 2019-04-05 NOTE — Progress Notes (Signed)
[place] at [place] NAME: Michael Vega    MR#:  [id number]  DATE OF BIRTH:  11-24-1995  SUBJECTIVE:   Patient still has generalized weakness and  short of breath, oxygen by nasal cannula 2.5 L.  REVIEW OF SYSTEMS:    Review of Systems  Constitutional: Negative for fever, chills weight loss HENT: Negative for ear pain, nosebleeds, congestion, facial swelling, rhinorrhea, neck pain, neck stiffness and ear discharge.   Respiratory: ++ for cough, shortness of breath, no wheezing  Cardiovascular: Negative for chest pain, palpitations and leg swelling.  Gastrointestinal: Negative for heartburn, abdominal pain, vomiting, diarrhea or consitpation Genitourinary: Negative for dysuria, urgency, frequency, hematuria Musculoskeletal: Negative for back pain or joint pain Neurological: Negative for dizziness, seizures, syncope, focal weakness,  numbness and headaches.  Hematological: Does not bruise/bleed easily.  Psychiatric/Behavioral: Negative for hallucinations, confusion, dysphoric mood Tolerating Diet: yes DRUG ALLERGIES:  No Known Allergies  VITALS:  Blood pressure 115/67, pulse (!) 107, temperature 100.3 F (37.9 C), temperature source Oral, resp. rate 19, height 5\' 9"  (1.753 m), weight 107.9 kg, SpO2 93 %.  PHYSICAL EXAMINATION:  Constitutional: Appears well-developed and well-nourished. No distress. HENT: Normocephalic. Marland Kitchen Oropharynx is clear and moist.  Eyes: Conjunctivae and EOM are normal. PERRLA, no scleral icterus.  Neck: Normal ROM. Neck supple. No JVD. No tracheal deviation. CVS: RRR, S1/S2 +, no murmurs, no gallops, no carotid bruit.  Pulmonary: Effort and breath sounds normal, no stridor, rhonchi, wheezes, rales.  Abdominal: Soft. BS +,  no distension, tenderness, rebound or guarding.  Musculoskeletal: Normal range of motion. No edema and no tenderness.  Neuro: Alert. CN 2-12 grossly intact. No focal deficits. Skin: Skin is  warm and dry. No rash noted. Psychiatric: Normal mood and affect.  LABORATORY PANEL:   CBC Recent Labs  Lab 04/04/19 0511  WBC 3.6*  HGB 13.8  HCT 41.0  PLT 196   ------------------------------------------------------------------------------------------------------------------  Chemistries  Recent Labs  Lab 04/03/19 1307 04/04/19 0511  NA 139 141  K 3.4* 3.6  CL 104 109  CO2 26 24  GLUCOSE 103* 97  BUN 10 9  CREATININE 1.26* 0.96  CALCIUM 8.0* 7.9*  AST 42*  --   ALT 20  --   ALKPHOS 43  --   BILITOT 0.6  --    ------------------------------------------------------------------------------------------------------------------  Cardiac Enzymes No results for input(s): TROPONINI in the last 168 hours. ------------------------------------------------------------------------------------------------------------------  RADIOLOGY:  No results found.   ASSESSMENT AND PLAN:   23 year old male with history of schizophrenia who presented to the emergency room due to shortness of breath and fever.  1.  Acute hypoxic respiratory failure in the setting of multifocal pneumonia with fever (community-acquired): Continue Rocephin and Zithromax Incentive spirometer, albuterol NEB every 6 hours. COVID testing is negative.  Blood culture is negative so far. Try to wean off oxygen. CT scan negative for pulmonary emboli  2.  Hypokalemia: Improved with potassium supplement.  3.  Schizophrenia: Continue Zyprexa Management plans discussed with the patient and his mother and they are in agreement.  CODE STATUS: full  TOTAL TIME TAKING CARE OF THIS PATIENT: 29 minutes.  POSSIBLE D/C tomorrow, DEPENDING ON CLINICAL CONDITION.   Demetrios Loll M.D on 04/05/2019 at 2:53 PM  Between 7am to 6pm - Pager - [phone] After 6pm go to [email or web address] - password [email or web address] [place] [facility]  Office  [phone]  CC: Primary care physician; Trinna Post, PA-C  Note: This  dictation was prepared  with Dragon dictation along with smaller phrase technology. Any transcriptional errors that result from this process are unintentional.

## 2019-04-06 ENCOUNTER — Other Ambulatory Visit (HOSPITAL_COMMUNITY): Payer: Self-pay | Admitting: Psychiatry

## 2019-04-06 DIAGNOSIS — J9601 Acute respiratory failure with hypoxia: Secondary | ICD-10-CM | POA: Diagnosis not present

## 2019-04-06 DIAGNOSIS — J189 Pneumonia, unspecified organism: Secondary | ICD-10-CM | POA: Diagnosis not present

## 2019-04-06 DIAGNOSIS — F200 Paranoid schizophrenia: Secondary | ICD-10-CM

## 2019-04-06 DIAGNOSIS — F339 Major depressive disorder, recurrent, unspecified: Secondary | ICD-10-CM

## 2019-04-06 DIAGNOSIS — F209 Schizophrenia, unspecified: Secondary | ICD-10-CM | POA: Diagnosis not present

## 2019-04-06 LAB — MAGNESIUM: Magnesium: 2.6 mg/dL — ABNORMAL HIGH (ref 1.7–2.4)

## 2019-04-06 LAB — CBC
HCT: 40.5 % (ref 39.0–52.0)
Hemoglobin: 13.3 g/dL (ref 13.0–17.0)
MCH: 28.2 pg (ref 26.0–34.0)
MCHC: 32.8 g/dL (ref 30.0–36.0)
MCV: 86 fL (ref 80.0–100.0)
Platelets: 232 10*3/uL (ref 150–400)
RBC: 4.71 MIL/uL (ref 4.22–5.81)
RDW: 12.3 % (ref 11.5–15.5)
WBC: 4.4 10*3/uL (ref 4.0–10.5)
nRBC: 0 % (ref 0.0–0.2)

## 2019-04-06 LAB — FERRITIN: Ferritin: 526 ng/mL — ABNORMAL HIGH (ref 24–336)

## 2019-04-06 MED ORDER — ALBUTEROL SULFATE (2.5 MG/3ML) 0.083% IN NEBU
2.5000 mg | INHALATION_SOLUTION | Freq: Four times a day (QID) | RESPIRATORY_TRACT | Status: DC
  Administered 2019-04-06 – 2019-04-08 (×8): 2.5 mg via RESPIRATORY_TRACT
  Filled 2019-04-06 (×8): qty 3

## 2019-04-06 MED ORDER — LEVOFLOXACIN IN D5W 750 MG/150ML IV SOLN
750.0000 mg | INTRAVENOUS | Status: DC
  Administered 2019-04-06 – 2019-04-07 (×2): 750 mg via INTRAVENOUS
  Filled 2019-04-06 (×3): qty 150

## 2019-04-06 NOTE — Progress Notes (Signed)
Placed on 2l [place] after neb treatment completed
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
## 2019-04-06 NOTE — Progress Notes (Signed)
[employer] [facility] - [place] at [facility]   PATIENT NAME: Michael Vega    MR#:  [id number]  DATE OF BIRTH:  05/15/1996  SUBJECTIVE:   Patient still has generalized weakness and  short of breath, oxygen by nasal cannula 3 L.  REVIEW OF SYSTEMS:    Review of Systems  Constitutional: Negative for fever, chills weight loss.  Generalized weakness. HENT: Negative for ear pain, nosebleeds, congestion, facial swelling, rhinorrhea, neck pain, neck stiffness and ear discharge.   Respiratory: Still has cough, shortness of breath, no wheezing  Cardiovascular: Negative for chest pain, palpitations and leg swelling.  Gastrointestinal: Negative for heartburn, abdominal pain, vomiting, diarrhea or consitpation Genitourinary: Negative for dysuria, urgency, frequency, hematuria Musculoskeletal: Negative for back pain or joint pain Neurological: Negative for dizziness, seizures, syncope, focal weakness,  numbness and headaches.  Hematological: Does not bruise/bleed easily.  Psychiatric/Behavioral: Negative for hallucinations, confusion, dysphoric mood Tolerating Diet: yes DRUG ALLERGIES:  No Known Allergies  VITALS:  Blood pressure (!) 101/56, pulse (!) 104, temperature 97.9 F (36.6 C), temperature source Oral, resp. rate (!) 27, height 5\' 9"  (1.753 m), weight 106.4 kg, SpO2 93 %.  PHYSICAL EXAMINATION:  Constitutional: Appears well-developed and well-nourished. No distress. HENT: Normocephalic. Marland Kitchen. Oropharynx is clear and moist.  Eyes: Conjunctivae and EOM are normal. PERRLA, no scleral icterus.  Neck: Normal ROM. Neck supple. No JVD. No tracheal deviation. CVS: RRR, S1/S2 +, no murmurs, no gallops, no carotid bruit.  Pulmonary: Effort and breath sounds normal, no stridor, rhonchi, wheezes, rales.  Bibasilar coarse breath sounds. Abdominal: Soft. BS +,  no distension, tenderness, rebound or guarding.  Musculoskeletal: Normal range of motion. No edema and no tenderness.  Neuro:  Alert. CN 2-12 grossly intact. No focal deficits. Skin: Skin is warm and dry. No rash noted. Psychiatric: Normal mood and affect.  LABORATORY PANEL:   CBC Recent Labs  Lab 04/06/19 0531  WBC 4.4  HGB 13.3  HCT 40.5  PLT 232   ------------------------------------------------------------------------------------------------------------------  Chemistries  Recent Labs  Lab 04/03/19 1307 04/04/19 0511 04/06/19 0531  NA 139 141  --   K 3.4* 3.6  --   CL 104 109  --   CO2 26 24  --   GLUCOSE 103* 97  --   BUN 10 9  --   CREATININE 1.26* 0.96  --   CALCIUM 8.0* 7.9*  --   MG  --   --  2.6*  AST 42*  --   --   ALT 20  --   --   ALKPHOS 43  --   --   BILITOT 0.6  --   --    ------------------------------------------------------------------------------------------------------------------  Cardiac Enzymes No results for input(s): TROPONINI in the last 168 hours. ------------------------------------------------------------------------------------------------------------------  RADIOLOGY:  No results found.   ASSESSMENT AND PLAN:   23 year old male with history of schizophrenia who presented to the emergency room due to shortness of breath and fever.  1.  Acute hypoxic respiratory failure in the setting of multifocal pneumonia with fever (community-acquired):  He was treated with Rocephin and Zithromax, then changed to p.o. ceftin and Zithromax.  Change to IV Levaquin. Incentive spirometer, albuterol NEB every 6 hours. COVID testing is negative.  Blood culture is negative so far. Try to wean off oxygen. CT scan negative for pulmonary emboli.   Pulmonary consult.  2.  Hypokalemia: Improved with potassium supplement.  3.  Schizophrenia: Continue Zyprexa Management plans discussed with the patient and his mother and they are  in agreement.  CODE STATUS: full  TOTAL TIME TAKING CARE OF THIS PATIENT: 27 minutes.  POSSIBLE D/C tomorrow, DEPENDING ON CLINICAL  CONDITION.   Demetrios Loll M.D on 04/06/2019 at 12:40 PM  Between 7am to 6pm - Pager - [phone] After 6pm go to [email or web address] - password [email or web address] [facility]  Office  [phone]  CC: Primary care physician; Trinna Post, PA-C  Note: This dictation was prepared with Dragon dictation along with smaller phrase technology. Any transcriptional errors that result from this process are unintentional.

## 2019-04-07 DIAGNOSIS — J9601 Acute respiratory failure with hypoxia: Secondary | ICD-10-CM | POA: Diagnosis not present

## 2019-04-07 DIAGNOSIS — F209 Schizophrenia, unspecified: Secondary | ICD-10-CM | POA: Diagnosis not present

## 2019-04-07 DIAGNOSIS — J189 Pneumonia, unspecified organism: Secondary | ICD-10-CM | POA: Diagnosis not present

## 2019-04-07 LAB — RESPIRATORY PANEL BY PCR

## 2019-04-07 LAB — STREP PNEUMONIAE URINARY ANTIGEN: Strep Pneumo Urinary Antigen: NEGATIVE

## 2019-04-07 LAB — EXPECTORATED SPUTUM ASSESSMENT W GRAM STAIN, RFLX TO RESP C

## 2019-04-07 LAB — C-REACTIVE PROTEIN: CRP: 4.7 mg/dL — ABNORMAL HIGH (ref <1.0)

## 2019-04-07 MED ORDER — PREDNISONE 50 MG PO TABS
50.0000 mg | ORAL_TABLET | Freq: Every day | ORAL | Status: DC
  Administered 2019-04-07 – 2019-04-08 (×2): 50 mg via ORAL
  Filled 2019-04-07 (×2): qty 1

## 2019-04-07 NOTE — Progress Notes (Addendum)
[place] at [place] NAME: Michael Vega    MR#:  [id number]  DATE OF BIRTH:  [birth year]  SUBJECTIVE:   Patient feels better still has shortness breath and generalized weakness.  On oxygen by nasal cannula 3 L this morning. REVIEW OF SYSTEMS:    Review of Systems  Constitutional: Negative for fever, chills weight loss.  Generalized weakness. HENT: Negative for ear pain, nosebleeds, congestion, facial swelling, rhinorrhea, neck pain, neck stiffness and ear discharge.   Respiratory: Still has cough, shortness of breath, no wheezing  Cardiovascular: Negative for chest pain, palpitations and leg swelling.  Gastrointestinal: Negative for heartburn, abdominal pain, vomiting, diarrhea or consitpation Genitourinary: Negative for dysuria, urgency, frequency, hematuria Musculoskeletal: Negative for back pain or joint pain Neurological: Negative for dizziness, seizures, syncope, focal weakness,  numbness and headaches.  Hematological: Does not bruise/bleed easily.  Psychiatric/Behavioral: Negative for hallucinations, confusion, dysphoric mood Tolerating Diet: yes DRUG ALLERGIES:  No Known Allergies  VITALS:  Blood pressure 110/67, pulse 95, temperature (!) 97.4 F (36.3 C), temperature source Oral, resp. rate (!) 24, height 5\' 9"  (1.753 m), weight 106.4 kg, SpO2 96 %.  PHYSICAL EXAMINATION:  Constitutional: Appears well-developed and well-nourished. No distress. HENT: Normocephalic. Marland Kitchen Oropharynx is clear and moist.  Eyes: Conjunctivae and EOM are normal. PERRLA, no scleral icterus.  Neck: Normal ROM. Neck supple. No JVD. No tracheal deviation. CVS: RRR, S1/S2 +, no murmurs, no gallops, no carotid bruit.  Pulmonary: Effort and breath sounds normal, no stridor, rhonchi, wheezes, rales.  Bibasilar coarse breath sounds. Abdominal: Soft. BS +,  no distension, tenderness, rebound or guarding.  Musculoskeletal: Normal range of motion. No edema and no  tenderness.  Neuro: Alert. CN 2-12 grossly intact. No focal deficits. Skin: Skin is warm and dry. No rash noted. Psychiatric: Normal mood and affect.  LABORATORY PANEL:   CBC Recent Labs  Lab 04/06/19 0531  WBC 4.4  HGB 13.3  HCT 40.5  PLT 232   ------------------------------------------------------------------------------------------------------------------  Chemistries  Recent Labs  Lab 04/03/19 1307 04/04/19 0511 04/06/19 0531  NA 139 141  --   K 3.4* 3.6  --   CL 104 109  --   CO2 26 24  --   GLUCOSE 103* 97  --   BUN 10 9  --   CREATININE 1.26* 0.96  --   CALCIUM 8.0* 7.9*  --   MG  --   --  2.6*  AST 42*  --   --   ALT 20  --   --   ALKPHOS 43  --   --   BILITOT 0.6  --   --    ------------------------------------------------------------------------------------------------------------------  Cardiac Enzymes No results for input(s): TROPONINI in the last 168 hours. ------------------------------------------------------------------------------------------------------------------  RADIOLOGY:  No results found.   ASSESSMENT AND PLAN:   23 year old male with history of schizophrenia who presented to the emergency room due to shortness of breath and fever.  1.  Acute hypoxic respiratory failure in the setting of multifocal pneumonia with fever (community-acquired):  He was treated with Rocephin and Zithromax, then changed to p.o. ceftin and Zithromax.  Changed to IV Levaquin. Incentive spirometer, albuterol NEB every 6 hours. COVID testing is negative.  Blood culture is negative so far. Try to wean off oxygen. CT scan negative for pulmonary emboli.  Chest physiotherapy with IS at bedside   - RT working with patient with nebulizer therapy per Dr. Keenan Bachelor.  2.  Hypokalemia: Improved with potassium supplement.  3.  Schizophrenia: Continue Zyprexa Management plans discussed with the patient and his mother and they are in agreement.  CODE STATUS:  full,  TOTAL TIME TAKING CARE OF THIS PATIENT: 28 minutes.  POSSIBLE D/C tomorrow, DEPENDING ON CLINICAL CONDITION.   Shaune PollackQing Earlie Schank M.D on 04/07/2019 at 2:20 PM  Between 7am to 6pm - Pager - [phone] After 6pm go to [email or web address] - password [email or web address] [facility]  [facility]  Office  [phone]  CC: Primary care physician; Trey SailorsPollak, Adriana M, PA-C  Note: This dictation was prepared with Dragon dictation along with smaller phrase technology. Any transcriptional errors that result from this process are unintentional.
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
## 2019-04-08 DIAGNOSIS — J189 Pneumonia, unspecified organism: Secondary | ICD-10-CM | POA: Diagnosis not present

## 2019-04-08 DIAGNOSIS — J9601 Acute respiratory failure with hypoxia: Secondary | ICD-10-CM | POA: Diagnosis not present

## 2019-04-08 DIAGNOSIS — F209 Schizophrenia, unspecified: Secondary | ICD-10-CM | POA: Diagnosis not present

## 2019-04-08 LAB — CULTURE, BLOOD (ROUTINE X 2)
Culture: NO GROWTH
Culture: NO GROWTH
Special Requests: ADEQUATE
Special Requests: ADEQUATE

## 2019-04-08 LAB — LEGIONELLA PNEUMOPHILA SEROGP 1 UR AG: L. pneumophila Serogp 1 Ur Ag: NEGATIVE

## 2019-04-08 MED ORDER — ALBUTEROL SULFATE HFA 108 (90 BASE) MCG/ACT IN AERS: 1.0000 | INHALATION_SPRAY | Freq: Four times a day (QID) | RESPIRATORY_TRACT | 2 refills | Status: DC | PRN

## 2019-04-08 MED ORDER — LEVOFLOXACIN 750 MG PO TABS: 750.0000 mg | ORAL_TABLET | Freq: Every day | ORAL | 0 refills | Status: AC

## 2019-04-08 MED ORDER — GUAIFENESIN-DM 100-10 MG/5ML PO SYRP: 5.0000 mL | ORAL_SOLUTION | ORAL | 0 refills | Status: DC | PRN
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
## 2019-04-08 NOTE — Discharge Summary (Signed)
[employer] [facility] [place] at [facility], [facility]   PATIENT NAME: Michael Vega    MR#:  [id number]  DATE OF BIRTH:  05/04/1996  DATE OF ADMISSION:  04/03/2019   ADMITTING PHYSICIAN: Alford Highlandichard Wieting, MD  DATE OF DISCHARGE: 04/08/2019 PRIMARY CARE PHYSICIAN: Trey SailorsPollak, Adriana M, PA-C   ADMISSION DIAGNOSIS:  Hypoxia [R09.02] Community acquired pneumonia, unspecified laterality [J18.9] Pneumonia [J18.9] DISCHARGE DIAGNOSIS:  Active Problems:   Pneumonia  SECONDARY DIAGNOSIS:   Past Medical History:  Diagnosis Date  . Depression   . Schizophrenia (HCC)   . Schizophrenia, paranoid [facility](HCC)    HOSPITAL COURSE:  23 year old male with history of schizophrenia who presented to the emergency room due to shortness of breath and fever.  1.  Acute hypoxic respiratory failure in the setting of multifocal pneumonia with fever (community-acquired):  He was treated with Rocephin and Zithromax, then changed to p.o. ceftin and Zithromax.  Changed to IV Levaquin, change to po on discharge. He is treated with incentive spirometer, albuterol NEB every 6 hours. COVID testing is negative.  Blood culture is negative so far. Weaned off oxygen. CT scan negative for pulmonary emboli.  Chest physiotherapy with IS at bedside  - RT working with patient with nebulizer therapy per Dr. Richardson DoppAleskerove.  2.  Hypokalemia: Improved with potassium supplement.  3.  Schizophrenia: Continue Zyprexa DISCHARGE CONDITIONS:   CONSULTS OBTAINED:  Treatment Team:  Vida RiggerAleskerov, Fuad, MD DRUG ALLERGIES:  No Known Allergies DISCHARGE MEDICATIONS:   Allergies as of 04/08/2019   No Known Allergies     Medication List    TAKE these medications   albuterol 108 (90 Base) MCG/ACT inhaler Commonly known as: VENTOLIN HFA Inhale 1-2 puffs into the lungs every 6 (six) hours as needed for wheezing or shortness of breath.   guaiFENesin-dextromethorphan 100-10 MG/5ML syrup Commonly known as: ROBITUSSIN DM Take 5 mLs  by mouth every 4 (four) hours as needed for cough.   levofloxacin 750 MG tablet Commonly known as: Levaquin Take 1 tablet (750 mg total) by mouth daily for 5 days.   OLANZapine 5 MG tablet Commonly known as: ZYPREXA Take 1 tablet (5 mg total) by mouth daily.        DISCHARGE INSTRUCTIONS:  See AVS. If you experience worsening of your admission symptoms, develop shortness of breath, life threatening emergency, suicidal or homicidal thoughts you must seek medical attention immediately by calling 911 or calling your MD immediately  if symptoms less severe.  You Must read complete instructions/literature along with all the possible adverse reactions/side effects for all the Medicines you take and that have been prescribed to you. Take any new Medicines after you have completely understood and accpet all the possible adverse reactions/side effects.   Please note  You were cared for by a hospitalist during your hospital stay. If you have any questions about your discharge medications or the care you received while you were in the hospital after you are discharged, you can call the unit and asked to speak with the hospitalist on call if the hospitalist that took care of you is not available. Once you are discharged, your primary care physician will handle any further medical issues. Please note that NO REFILLS for any discharge medications will be authorized once you are discharged, as it is imperative that you return to your primary care physician (or establish a relationship with a primary care physician if you do not have one) for your aftercare needs so that they can reassess your need for medications  and monitor your lab values.    On the day of Discharge:  VITAL SIGNS:  Blood pressure 115/72, pulse 80, temperature 97.8 F (36.6 C), temperature source Oral, resp. rate 20, height 5\' 9"  (1.753 m), weight 106.4 kg, SpO2 95 %. PHYSICAL EXAMINATION:  GENERAL:  23 y.o.-year-old patient lying  in the bed with no acute distress.  EYES: Pupils equal, round, reactive to light and accommodation. No scleral icterus. Extraocular muscles intact.  HEENT: Head atraumatic, normocephalic. Oropharynx and nasopharynx clear.  NECK:  Supple, no jugular venous distention. No thyroid enlargement, no tenderness.  LUNGS: Normal breath sounds bilaterally, no wheezing, rales,rhonchi or crepitation. No use of accessory muscles of respiration.  CARDIOVASCULAR: S1, S2 normal. No murmurs, rubs, or gallops.  ABDOMEN: Soft, non-tender, non-distended. Bowel sounds present. No organomegaly or mass.  EXTREMITIES: No pedal edema, cyanosis, or clubbing.  NEUROLOGIC: Cranial nerves II through XII are intact. Muscle strength 5/5 in all extremities. Sensation intact. Gait not checked.  PSYCHIATRIC: The patient is alert and oriented x 3.  SKIN: No obvious rash, lesion, or ulcer.  DATA REVIEW:   CBC Recent Labs  Lab 04/06/19 0531  WBC 4.4  HGB 13.3  HCT 40.5  PLT 232    Chemistries  Recent Labs  Lab 04/03/19 1307 04/04/19 0511 04/06/19 0531  NA 139 141  --   K 3.4* 3.6  --   CL 104 109  --   CO2 26 24  --   GLUCOSE 103* 97  --   BUN 10 9  --   CREATININE 1.26* 0.96  --   CALCIUM 8.0* 7.9*  --   MG  --   --  2.6*  AST 42*  --   --   ALT 20  --   --   ALKPHOS 43  --   --   BILITOT 0.6  --   --      Microbiology Results  Results for orders placed or performed during the hospital encounter of 04/03/19  SARS Coronavirus 2 (CEPHEID- Performed in [facility] hospital lab), Hosp Order     Status: None   Collection Time: 04/03/19 11:26 AM   Specimen: Nasopharyngeal Swab  Result Value Ref Range Status   SARS Coronavirus 2 NEGATIVE NEGATIVE Final    Comment: (NOTE) If result is NEGATIVE SARS-CoV-2 target nucleic acids are NOT DETECTED. The SARS-CoV-2 RNA is generally detectable in upper and lower  respiratory specimens during the acute phase of infection. The lowest  concentration of SARS-CoV-2  viral copies this assay can detect is 250  copies / mL. A negative result does not preclude SARS-CoV-2 infection  and should not be used as the sole basis for treatment or other  patient management decisions.  A negative result may occur with  improper specimen collection / handling, submission of specimen other  than nasopharyngeal swab, presence of viral mutation(s) within the  areas targeted by this assay, and inadequate number of viral copies  (<250 copies / mL). A negative result must be combined with clinical  observations, patient history, and epidemiological information. If result is POSITIVE SARS-CoV-2 target nucleic acids are DETECTED. The SARS-CoV-2 RNA is generally detectable in upper and lower  respiratory specimens dur ing the acute phase of infection.  Positive  results are indicative of active infection with SARS-CoV-2.  Clinical  correlation with patient history and other diagnostic information is  necessary to determine patient infection status.  Positive results do  not rule out bacterial infection or co-infection  with other viruses. If result is PRESUMPTIVE POSTIVE SARS-CoV-2 nucleic acids MAY BE PRESENT.   A presumptive positive result was obtained on the submitted specimen  and confirmed on repeat testing.  While 2019 novel coronavirus  (SARS-CoV-2) nucleic acids may be present in the submitted sample  additional confirmatory testing may be necessary for epidemiological  and / or clinical management purposes  to differentiate between  SARS-CoV-2 and other Sarbecovirus currently known to infect humans.  If clinically indicated additional testing with an alternate test  methodology [phone]) is advised. The SARS-CoV-2 RNA is generally  detectable in upper and lower respiratory sp ecimens during the acute  phase of infection. The expected result is Negative. Fact Sheet for Patients:  StrictlyIdeas.no Fact Sheet for Healthcare Providers:  BankingDealers.co.za This test is not yet approved or cleared by the [place] FDA and has been authorized for detection and/or diagnosis of SARS-CoV-2 by FDA under an Emergency Use Authorization (EUA).  This EUA will remain in effect (meaning this test can be used) for the duration of the COVID-19 declaration under Section 564(b)(1) of the Act, 21 U.S.C. section 360bbb-3(b)(1), unless the authorization is terminated or revoked sooner. Performed at [facility], [address] [place], [place] [address]   Novel Coronavirus,NAA,(SEND-OUT TO REF LAB - TAT 24-48 hrs); Hosp Order     Status: None   Collection Time: 04/03/19  3:09 PM   Specimen: Nasopharyngeal Swab; Respiratory  Result Value Ref Range Status   SARS-CoV-2, NAA NOT DETECTED NOT DETECTED Final    Comment: (NOTE) This test was developed and its performance characteristics determined by [employer]. This test has not been FDA cleared or approved. This test has been authorized by FDA under an Emergency Use Authorization (EUA). This test is only authorized for the duration of time the declaration that circumstances exist justifying the authorization of the emergency use of in vitro diagnostic tests for detection of SARS-CoV-2 virus and/or diagnosis of COVID-19 infection under section 564(b)(1) of the Act, 21 U.S.C. 657QIO-9(G)(2), unless the authorization is terminated or revoked sooner. When diagnostic testing is negative, the possibility of a false negative result should be considered in the context of a patient's recent exposures and the presence of clinical signs and symptoms consistent with COVID-19. An individual without symptoms of COVID-19 and who is not shedding SARS-CoV-2 virus would expect to have a negative (not detected) result in this assay. Performed  At: [facility] [address] [place], [place] [address] Rush Farmer MD MW:[id number]    [place]  Final    Comment: Performed at [facility], [address] [place], [place] [address]  CULTURE, BLOOD (ROUTINE X 2) w Reflex to ID Panel     Status: None   Collection Time: 04/03/19  9:39 PM   Specimen: BLOOD  Result Value Ref Range Status   Specimen Description BLOOD LEFT ANTECUBITAL  Final   Special Requests   Final    BOTTLES DRAWN AEROBIC AND ANAEROBIC Blood Culture adequate volume   Culture   Final    NO GROWTH 5 DAYS Performed at [facility], [address] [place], [place] [address]    Report Status 04/08/2019 FINAL  Final  CULTURE, BLOOD (ROUTINE X 2) w Reflex to ID Panel     Status: None   Collection Time: 04/03/19  9:45 PM   Specimen: BLOOD  Result Value Ref Range Status   Specimen Description BLOOD BLOOD RIGHT HAND  Final   Special Requests  Final    BOTTLES DRAWN AEROBIC ONLY Blood Culture adequate volume   Culture   Final    NO GROWTH 5 DAYS Performed at [facility], [address] [place], [place] [address]    Report Status 04/08/2019 FINAL  Final  Respiratory Panel by PCR     Status: None   Collection Time: 04/06/19  4:39 PM   Specimen: Nasopharyngeal Swab; Respiratory  Result Value Ref Range Status   Adenovirus NOT DETECTED NOT DETECTED Final   Coronavirus 229E NOT DETECTED NOT DETECTED Final    Comment: (NOTE) The Coronavirus on the Respiratory Panel, DOES NOT test for the novel  Coronavirus (2019 nCoV)    Coronavirus HKU1 NOT DETECTED NOT DETECTED Final   Coronavirus NL63 NOT DETECTED NOT DETECTED Final   Coronavirus OC43 NOT DETECTED NOT DETECTED Final   Metapneumovirus NOT DETECTED NOT DETECTED Final   Rhinovirus / Enterovirus NOT DETECTED NOT DETECTED Final   Influenza A NOT DETECTED NOT DETECTED Final   Influenza B NOT DETECTED NOT DETECTED Final   Parainfluenza Virus 1 NOT DETECTED NOT DETECTED Final   Parainfluenza Virus 2 NOT DETECTED NOT DETECTED Final   Parainfluenza Virus 3 NOT  DETECTED NOT DETECTED Final   Parainfluenza Virus 4 NOT DETECTED NOT DETECTED Final   Respiratory Syncytial Virus NOT DETECTED NOT DETECTED Final   Bordetella pertussis NOT DETECTED NOT DETECTED Final   Chlamydophila pneumoniae NOT DETECTED NOT DETECTED Final   Mycoplasma pneumoniae NOT DETECTED NOT DETECTED Final    Comment: Performed at [facility], [address] [place], [place] [address]  Expectorated sputum assessment w rflx to resp cult     Status: None   Collection Time: 04/07/19 10:08 AM   Specimen: Sputum  Result Value Ref Range Status   Specimen Description SPUTUM  Final   Special Requests Immunocompromised  Final   Sputum evaluation   Final    THIS SPECIMEN IS ACCEPTABLE FOR SPUTUM CULTURE Performed at [facility], [address] [place], [place] [address]    Report Status 04/07/2019 FINAL  Final  Culture, respiratory     Status: None (Preliminary result)   Collection Time: 04/07/19 10:08 AM   Specimen: SPU  Result Value Ref Range Status   Specimen Description   Final    SPUTUM Performed at [facility], [address] [place], [place] [address]    Special Requests   Final    Immunocompromised Reflexed from [id number] Performed at [facility], [address] [place], [place] [address]    Gram Stain   Final    RARE WBC PRESENT, PREDOMINANTLY PMN FEW GRAM POSITIVE COCCI IN CLUSTERS    Culture   Final    CULTURE REINCUBATED FOR BETTER GROWTH Performed at [facility], [address] [place], [place] [address]    Report Status PENDING  Incomplete    RADIOLOGY:  No results found.   Management plans discussed with the patient, family and they are in agreement.  CODE STATUS: Full Code   TOTAL TIME TAKING CARE OF THIS PATIENT: 32 minutes.    Shaune PollackQing Shalissa Easterwood M.D on 04/08/2019 at 11:05 AM  Between 7am to 6pm - Pager - [phone]  After 6pm go to [email or web address] - password [email or web address] [facility]  [facility] [place]  [facility]  Office  [phone]  CC: Primary care physician; Trey SailorsPollak, Adriana M, PA-C   Note: This dictation was prepared with Dragon dictation along with smaller phrase technology. Any transcriptional errors that  result from this process are unintentional.

## 2019-04-09 ENCOUNTER — Telehealth: Payer: Self-pay

## 2019-04-09 LAB — CULTURE, RESPIRATORY W GRAM STAIN: Culture: NORMAL

## 2019-04-09 LAB — FUNGITELL, SERUM: Fungitell Result: <31 pg/mL (ref <80)

## 2019-04-09 NOTE — Telephone Encounter (Signed)
I have reviewed the [facility] Dr. Luvenia Heller Aleskerov's note in the computer and his assessment is respiratory failure due to bilateral pneumonia. I do not see any conflicting information about it. He has already been on this medication in the hospital and he improved. He has follow up with [facility] pul[facility]ology in clinic this week and they should call [facility] because I am not willing to send in a different antibiotic that I do not think will help him enough.

## 2019-04-09 NOTE — Telephone Encounter (Signed)
Pt was released from the hospital 04/08/2019.  He was prescribed Levaquin for pneumonia.  Pt states he is concerned about the side effects and would like a different antibiotic sent to [facility] in [place] if possible.   Contact number is his mother Jackelyn Poling [phone]   Thanks,   -Mickel Baas

## 2019-04-09 NOTE — Telephone Encounter (Signed)
Patient's mother Jackelyn Poling advised as below. She states that her son is afraid to take it due to the possible side effects he read on the packet insert. He is concerned about a statement that says serious side effects could happen at the same time resulting in death. Jackelyn Poling says that the Pulmonologist at [place] clinic felt that It may not be Pneumonia,  so they are getting conflicting information from different doctors and they don't know what to do. Patient does not want to take Levaquin. Jackelyn Poling is requesting a different antibiotic. Please advise.

## 2019-04-09 NOTE — Telephone Encounter (Signed)
Patient's mom Jackelyn Poling advised and verbally voiced understanding. She agrees to contact Pulmonology in the morning.

## 2019-04-09 NOTE — Telephone Encounter (Signed)
This is the medication he needs to be on, due to all the other antibiotics he was taking during hospitalization. All antibiotics have side effects and another antibiotic may not be as powerful as this one. He should take the Levaquin.

## 2019-04-10 ENCOUNTER — Ambulatory Visit (INDEPENDENT_AMBULATORY_CARE_PROVIDER_SITE_OTHER): Payer: Federal, State, Local not specified - PPO | Admitting: Psychiatry

## 2019-04-10 ENCOUNTER — Encounter (HOSPITAL_COMMUNITY): Payer: Self-pay | Admitting: Psychiatry

## 2019-04-10 ENCOUNTER — Other Ambulatory Visit: Payer: Self-pay

## 2019-04-10 DIAGNOSIS — F251 Schizoaffective disorder, depressive type: Secondary | ICD-10-CM

## 2019-04-10 MED ORDER — ARIPIPRAZOLE 2 MG PO TABS: 2.0000 mg | ORAL_TABLET | Freq: Every day | ORAL | 1 refills | Status: DC

## 2019-04-10 NOTE — Progress Notes (Signed)
Virtual Visit via Telephone Note  I connected with Michael Vega on 04/10/19 at  3:00 PM EDT by telephone and verified that I am speaking with the correct person using two identifiers.  Location: Patient: at his mom's home Provider: office   I discussed the limitations, risks, security and privacy concerns of performing an evaluation and management service by telephone and the availability of in person appointments. I also discussed with the patient that there may be a patient responsible charge related to this service. The patient expressed understanding and agreed to proceed.   History of Present Illness: Pt states he was not feeling well and was hospitalized for possible pneumonia for 4 days. Michael NeedleMichael was very scared because he thought he had COVID and was having worsening shortness of breath. Michael NeedleMichael was on a breathing tube. He had a few COVID tests and they were all negative. That was a huge relief. He got home today and is feeling better. Mood is good. Pt denies depression. He denies anxiety. He denies paranoia, AVH and ideas of reference.  Michael NeedleMichael denies SI/HI.  No  Mom wonders if Michael NeedleMichael was misdiagnosed.  She shares that he was bullied at school and was depressed.  At that point he was diagnosed with schizophrenia and started on Zyprexa.  He has been on Zyprexa since 2011.  She and patient both deny any psychotic symptoms since that time.  She is very concerned that his white blood cell count has decreased due to Zyprexa and would like him to change medications.  She is also wondering if he still needs to be treated with an antipsychotic or any medication at all.   Observations/Objective: I spoke with Michael ScaleMichael D Vega on the phone.  Pt was calm, pleasant and cooperative.  Pt was engaged in the conversation and answered questions appropriately.  Once his mother joined the call he was not as engaged.  Speech was clear and coherent with normal rate, tone and volume.  Mood is euthymic and  affect is full.  Thought processes are coherent, goal oriented and intact.  Thought content is logical.  Pt denies SI/HI.   Pt denies auditory and visual hallucinations and did not appear to be responding to internal stimuli.  Memory and concentration are good.  Fund of knowledge and use of language are average.  Insight and judgment are fair.  I am unable to comment on psychomotor activity, general appearance, hygiene, or eye contact as I was unable to physically see the patient on the phone.  Vital signs not available since interview conducted virtually.    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  2 days later it was 4.4.  The inpatient doctor expressed concern that the Zyprexa may be decreasing his white blood cell count.  Feb 21, 2018 his white blood cell count was normal.  It is possible the Zyprexa is negatively affecting his white blood cell count and therefore I will discontinue it.  Follow Up Instructions: In 6-8 weeks or sooner   I discussed the assessment and treatment plan with the patient. The patient was provided an opportunity to ask questions and all were answered. The patient agreed with the plan and demonstrated an understanding of the instructions.   The patient was advised to call back or seek an  in-person evaluation if the symptoms worsen or if the condition fails to improve as anticipated.  I provided 25 minutes of non-face-to-face time during this encounter.   Charlcie Cradle, MD

## 2019-04-11 DIAGNOSIS — J4550 Severe persistent asthma, uncomplicated: Secondary | ICD-10-CM | POA: Diagnosis not present

## 2019-04-29 DIAGNOSIS — R0602 Shortness of breath: Secondary | ICD-10-CM | POA: Diagnosis not present

## 2019-04-30 DIAGNOSIS — J4550 Severe persistent asthma, uncomplicated: Secondary | ICD-10-CM | POA: Diagnosis not present

## 2019-05-03 ENCOUNTER — Other Ambulatory Visit (HOSPITAL_COMMUNITY): Payer: Self-pay | Admitting: Psychiatry

## 2019-05-03 DIAGNOSIS — F251 Schizoaffective disorder, depressive type: Secondary | ICD-10-CM

## 2019-05-22 ENCOUNTER — Ambulatory Visit (INDEPENDENT_AMBULATORY_CARE_PROVIDER_SITE_OTHER): Payer: Federal, State, Local not specified - PPO | Admitting: Psychiatry

## 2019-05-22 ENCOUNTER — Other Ambulatory Visit: Payer: Self-pay

## 2019-05-22 ENCOUNTER — Encounter (HOSPITAL_COMMUNITY): Payer: Self-pay | Admitting: Psychiatry

## 2019-05-22 DIAGNOSIS — F251 Schizoaffective disorder, depressive type: Secondary | ICD-10-CM | POA: Diagnosis not present

## 2019-05-22 DIAGNOSIS — Z79899 Other long term (current) drug therapy: Secondary | ICD-10-CM

## 2019-05-22 MED ORDER — ARIPIPRAZOLE 2 MG PO TABS: 2.0000 mg | ORAL_TABLET | Freq: Every day | ORAL | 0 refills | Status: DC

## 2019-05-22 NOTE — Progress Notes (Signed)
Patient ID: Michael Vega, male   DOB: 10-24-1995, 23 y.o.   MRN: [id number]  Virtual Visit via Video Note  I connected with Michael Vega on 05/22/19 at 10:15 AM EDT by a video enabled telemedicine application and verified that I am speaking with the correct person using two identifiers.  Location: Patient: Home Provider: Office   I discussed the limitations of evaluation and management by telemedicine and the availability of in person appointments. The patient expressed understanding and agreed to proceed.  History of Present Illness: Michael Vega reports that he likes the Abilify.  He states that he has lost 15 pounds simply by watching his calorie intake and food portion sizes.  He is sleeping better.  He gets about 8 hours of sleep and wakes up rested.  His energy is improved.  He tells me that with Zyprexa he always felt tired and would sleep 12+ hours a day.  His mood feels stable.  He is denying any signs of depression.  He denies worthlessness and hopelessness.  He states that he is interested in activities and is active.  [place] denies SI/HI.  Michael Vega denies AVH and paranoia.  His classes have restarted and he states that it is going well for now.   Observations/Objective: I did a video conference with Michael Vega.  Pt was calm, pleasant and cooperative.  Pt was engaged in the conversation and answered questions appropriately.  He was dressed in a row with a towel around his neck.  His hygiene appeared to be fair.  Eye contact was good.  Speech was clear and coherent with normal rate, tone and volume.  Psychomotor activity appeared to be normal.  No signs of akathisia, psychomotor retardation or hyperactivity. mood is euthymic and affect is full. Thought processes are coherent, goal oriented and intact.  Thought content is logical.  Pt denies SI/HI.   Pt denies auditory and visual hallucinations and did not appear to be responding to internal stimuli.  Memory and concentration are  good.  Fund of knowledge and use of language are average.  Insight and judgment are fair.   Vital signs not available since interview conducted virtually.    Assessment and Plan: Schizoaffective disorder-depressed type versus paranoid type; rule out MDD with psychotic features  continue Abilify 2mg  po qD  Labs: ordered CBC, CMP, HbA1c, Lipid panel, TSH, Prolactin level   Follow Up Instructions: In 3 months or sooner if needed   I discussed the assessment and treatment plan with the patient. The patient was provided an opportunity to ask questions and all were answered. The patient agreed with the plan and demonstrated an understanding of the instructions.   The patient was advised to call back or seek an in-person evaluation if the symptoms worsen or if the condition fails to improve as anticipated.  I provided 15 minutes of non-face-to-face time during this encounter.   Charlcie Cradle, MD

## 2019-07-09 DIAGNOSIS — F4322 Adjustment disorder with anxiety: Secondary | ICD-10-CM | POA: Diagnosis not present

## 2019-07-14 DIAGNOSIS — Z1159 Encounter for screening for other viral diseases: Secondary | ICD-10-CM | POA: Diagnosis not present

## 2019-07-16 DIAGNOSIS — J4550 Severe persistent asthma, uncomplicated: Secondary | ICD-10-CM | POA: Diagnosis not present

## 2019-07-16 DIAGNOSIS — Z23 Encounter for immunization: Secondary | ICD-10-CM | POA: Diagnosis not present

## 2019-07-16 DIAGNOSIS — R0602 Shortness of breath: Secondary | ICD-10-CM | POA: Diagnosis not present

## 2019-08-07 DIAGNOSIS — J3089 Other allergic rhinitis: Secondary | ICD-10-CM | POA: Diagnosis not present

## 2019-08-07 DIAGNOSIS — J4540 Moderate persistent asthma, uncomplicated: Secondary | ICD-10-CM | POA: Diagnosis not present

## 2019-08-07 DIAGNOSIS — J309 Allergic rhinitis, unspecified: Secondary | ICD-10-CM | POA: Diagnosis not present

## 2019-08-07 DIAGNOSIS — J301 Allergic rhinitis due to pollen: Secondary | ICD-10-CM | POA: Diagnosis not present

## 2019-08-14 ENCOUNTER — Other Ambulatory Visit: Payer: Self-pay

## 2019-08-14 ENCOUNTER — Encounter (HOSPITAL_COMMUNITY): Payer: Self-pay | Admitting: Psychiatry

## 2019-08-14 ENCOUNTER — Ambulatory Visit (INDEPENDENT_AMBULATORY_CARE_PROVIDER_SITE_OTHER): Payer: Federal, State, Local not specified - PPO | Admitting: Psychiatry

## 2019-08-14 DIAGNOSIS — F251 Schizoaffective disorder, depressive type: Secondary | ICD-10-CM | POA: Diagnosis not present

## 2019-08-14 MED ORDER — ARIPIPRAZOLE 2 MG PO TABS: 2.0000 mg | ORAL_TABLET | Freq: Every day | ORAL | 1 refills | Status: DC

## 2019-08-14 NOTE — Progress Notes (Signed)
  Virtual Visit via Telephone Note  I connected with Michael Vega on 08/14/19 at 10:00 AM EST by telephone and verified that I am speaking with the correct person using two identifiers.  Location: Patient: home Provider: office   I discussed the limitations, risks, security and privacy concerns of performing an evaluation and management service by telephone and the availability of in person appointments. I also discussed with the patient that there may be a patient responsible charge related to this service. The patient expressed understanding and agreed to proceed.   History of Present Illness: "Things are going good. Emotionally I am good. Physically I am ok". He states he is stable. He has not been depressed in a long while. He is sleeping and eating well. Michael Vega feels motivated and is doing well in school.  He denies anxiety. He is denying AVH and paranoia. He denies SI/HI. At this time he has no concerns and feels good. Michael Vega likes the Abilify.      Observations/Objective: I spoke with Michael Vega on the phone.  Pt was calm, pleasant and cooperative.  Pt was engaged in the conversation and answered questions appropriately.  Speech was clear and coherent with normal rate, tone and volume.  Mood is euthymic and affect is full. Thought processes are coherent, goal oriented and intact.  Thought content are logical.  Pt denies SI/HI.   Pt denies auditory and visual hallucinations and did not appear to be responding to internal stimuli.  Memory and concentration are good.  Fund of knowledge and use of language are average.  Insight and judgment are fair.  I am unable to comment on psychomotor activity, general appearance, hygiene, or eye contact as I was unable to physically see the patient on the phone.  Vital signs not available since interview conducted virtually.    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 Reminded pt to get labs done as soon as possible. He states he has been busy in school and forgot.  Status of current symptoms: doing well  Abilify 2mg  po qD  Follow Up Instructions: In 5-6 months or sooner if needed   I discussed the assessment and treatment plan with the patient. The patient was provided an opportunity to ask questions and all were answered. The patient agreed with the plan and demonstrated an understanding of the instructions.   The patient was advised to call back or seek an in-person evaluation if the symptoms worsen or if the condition fails to improve as anticipated.  I provided 25 minutes of non-face-to-face time during this encounter.   Charlcie Cradle, MD

## 2019-08-19 DIAGNOSIS — J301 Allergic rhinitis due to pollen: Secondary | ICD-10-CM | POA: Diagnosis not present

## 2019-08-20 DIAGNOSIS — J3089 Other allergic rhinitis: Secondary | ICD-10-CM | POA: Diagnosis not present

## 2019-08-20 DIAGNOSIS — J3081 Allergic rhinitis due to animal (cat) (dog) hair and dander: Secondary | ICD-10-CM | POA: Diagnosis not present

## 2019-10-29 DIAGNOSIS — Z20822 Contact with and (suspected) exposure to covid-19: Secondary | ICD-10-CM | POA: Diagnosis not present

## 2019-11-21 ENCOUNTER — Ambulatory Visit
Admission: EM | Admit: 2019-11-21 | Discharge: 2019-11-21 | Disposition: A | Discharge status: Home / Self Care | Payer: Federal, State, Local not specified - PPO | Attending: Family Medicine | Admitting: Family Medicine

## 2019-11-21 DIAGNOSIS — J358 Other chronic diseases of tonsils and adenoids: Secondary | ICD-10-CM | POA: Diagnosis not present

## 2019-11-21 NOTE — ED Triage Notes (Signed)
Pt presents with c/o small white area on his right tonsil. He states he has the sensation that something is stuck in his throat for a couple of days. He does have a mild sore throat. He denies fever/chills, sinus congestion, ear pain or other symptoms. He does not regularly get strep throat and has not been around any sick contacts.

## 2019-11-21 NOTE — Discharge Instructions (Signed)
Salt water gargles/mouthwash gargles Tylenol/ibuprofen as needed

## 2019-11-24 NOTE — ED Provider Notes (Signed)
[facility]    CSN: [id number] Arrival date & time: 11/21/19  1716      History   Chief Complaint Chief Complaint  Patient presents with  . Sore Throat    HPI Michael Vega is a 24 y.o. male.   24 yo male with a c/o right sided throat discomfort and sensation of "something stuck" in his throat for the past 2 days. Denies any fevers, chills, difficulty swallowing, sinus congestion, cough, sick contacts.    Sore Throat    Past Medical History:  Diagnosis Date  . Asthma   . Depression   . Schizophrenia ([place])   . Schizophrenia, paranoid [facility])     Patient Active Problem List   Diagnosis Date Noted  . Pneumonia 04/03/2019  . MDD (major depressive disorder), recurrent, in full remission ([place]) 03/09/2015  . Paranoid schizophrenia ([place]) 08/04/2014    Past Surgical History:  Procedure Laterality Date  . WISDOM TOOTH EXTRACTION         Home Medications    Prior to Admission medications   Medication Sig Start Date End Date Taking? Authorizing Provider  albuterol (VENTOLIN HFA) 108 (90 Base) MCG/ACT inhaler Inhale 1-2 puffs into the lungs every 6 (six) hours as needed for wheezing or shortness of breath. 04/08/19  Yes Demetrios Loll, MD  ARIPiprazole (ABILIFY) 2 MG tablet Take 1 tablet (2 mg total) by mouth daily. 08/14/19  Yes Charlcie Cradle, MD  budesonide (PULMICORT) 0.5 MG/2ML nebulizer solution Inhale into the lungs 2 (two) times daily. 04/11/19 04/10/20 Yes [provider]  budesonide-formoterol (SYMBICORT) 160-4.5 MCG/ACT inhaler Inhale 2 puffs into the lungs in the morning and at bedtime. 11/20/19  Yes [provider]  EPINEPHrine 0.3 mg/0.3 mL IJ SOAJ injection Inject 0.3 mg into the muscle as needed. 08/12/19  Yes [provider]  loratadine (CLARITIN) 10 MG tablet Take 10 mg by mouth daily as needed for allergies.    [provider]    Family History Family History  Problem Relation Age of Onset  . Healthy  Mother   . Healthy Father   . Cirrhosis Paternal Grandfather   . ADD / ADHD Neg Hx   . Alcohol abuse Neg Hx   . Anxiety disorder Neg Hx   . Bipolar disorder Neg Hx   . Depression Neg Hx   . Schizophrenia Neg Hx     Social History Social History   Tobacco Use  . Smoking status: Never Smoker  . Smokeless tobacco: Never Used  Substance Use Topics  . Alcohol use: No    Alcohol/week: 0.0 standard drinks  . Drug use: No     Allergies   Patient has no known allergies.   Review of Systems Review of Systems   Physical Exam Triage Vital Signs ED Triage Vitals  Enc Vitals Group     BP 11/21/19 1752 (!) 138/94     Pulse Rate 11/21/19 1752 79     Resp --      Temp 11/21/19 1752 98.2 F (36.8 C)     Temp Source 11/21/19 1752 Oral     SpO2 11/21/19 1752 99 %     Weight 11/21/19 1748 230 lb (104.3 kg)     Height 11/21/19 1748 5\' 9"  (1.753 m)     Head Circumference --      Peak Flow --      Pain Score 11/21/19 1746 6     Pain Loc --      Pain Edu? --  Excl. in GC? --    No data found.  Updated Vital Signs BP (!) 138/94 (BP Location: Left Arm)   Pulse 79   Temp 98.2 F (36.8 C) (Oral)   Ht 5\' 9"  (1.753 m)   Wt 104.3 kg   SpO2 99%   BMI 33.97 kg/m   Visual Acuity Right Eye Distance:   Left Eye Distance:   Bilateral Distance:    Right Eye Near:   Left Eye Near:    Bilateral Near:     Physical Exam Vitals reviewed.  Constitutional:      General: He is not in acute distress.    Appearance: He is not toxic-appearing or diaphoretic.  HENT:     Mouth/Throat:     Pharynx: Posterior oropharyngeal erythema present. No oropharyngeal exudate.     Comments: Right tonsil with erythema and single tonsil stone noted protruding Cardiovascular:     Rate and Rhythm: Normal rate.  Neurological:     Mental Status: He is alert.      UC Treatments / Results  Labs (all labs ordered are listed, but only abnormal results are displayed) Labs Reviewed - No data to  display  EKG   Radiology No results found.  Procedures Procedures (including critical care time)  Medications Ordered in UC Medications - No data to display  Initial Impression / Assessment and Plan / UC Course  I have reviewed the triage vital signs and the nursing notes.  Pertinent labs & imaging results that were available during my care of the patient were reviewed by me and considered in my medical decision making (see chart for details).     Final Clinical Impressions(s) / UC Diagnoses   Final diagnoses:  Tonsil stone     Discharge Instructions     Salt water gargles/mouthwash gargles Tylenol/ibuprofen as needed    ED Prescriptions    None     1. diagnosis reviewed with patient; tonsil stone removed with cotton tip applicator 2. Recommend supportive treatment as above 4. Follow-up prn if symptoms worsen or don't improve  PDMP not reviewed this encounter.   , MD 11/24/19 1343

## 2019-12-09 ENCOUNTER — Telehealth: Payer: Self-pay

## 2019-12-09 NOTE — Telephone Encounter (Signed)
Patient called requesting a refill on his Aripiprazole 2mg  to be sent to [facility] on [address] in [place]. He has a scheduled appointment on 01/08/20. Thank you.

## 2019-12-17 DIAGNOSIS — F4322 Adjustment disorder with anxiety: Secondary | ICD-10-CM | POA: Diagnosis not present

## 2019-12-18 ENCOUNTER — Other Ambulatory Visit (HOSPITAL_COMMUNITY): Payer: Self-pay | Admitting: Psychiatry

## 2019-12-18 DIAGNOSIS — F251 Schizoaffective disorder, depressive type: Secondary | ICD-10-CM

## 2019-12-18 MED ORDER — ARIPIPRAZOLE 2 MG PO TABS: 2.0000 mg | ORAL_TABLET | Freq: Every day | ORAL | 0 refills | Status: DC

## 2019-12-22 DIAGNOSIS — Z20828 Contact with and (suspected) exposure to other viral communicable diseases: Secondary | ICD-10-CM | POA: Diagnosis not present

## 2019-12-22 DIAGNOSIS — Z03818 Encounter for observation for suspected exposure to other biological agents ruled out: Secondary | ICD-10-CM | POA: Diagnosis not present

## 2019-12-23 ENCOUNTER — Other Ambulatory Visit: Payer: Self-pay

## 2019-12-24 NOTE — Telephone Encounter (Signed)
Had notified patient refill was done - LVM

## 2020-01-04 ENCOUNTER — Other Ambulatory Visit: Payer: Self-pay

## 2020-01-04 ENCOUNTER — Emergency Department: Payer: Federal, State, Local not specified - PPO

## 2020-01-04 ENCOUNTER — Emergency Department
Admission: EM | Admit: 2020-01-04 | Discharge: 2020-01-04 | Disposition: A | Discharge status: Home / Self Care | Payer: Federal, State, Local not specified - PPO | Attending: Emergency Medicine | Admitting: Emergency Medicine

## 2020-01-04 DIAGNOSIS — F29 Unspecified psychosis not due to a substance or known physiological condition: Secondary | ICD-10-CM | POA: Diagnosis not present

## 2020-01-04 DIAGNOSIS — S99922A Unspecified injury of left foot, initial encounter: Secondary | ICD-10-CM | POA: Diagnosis not present

## 2020-01-04 DIAGNOSIS — Z008 Encounter for other general examination: Secondary | ICD-10-CM

## 2020-01-04 DIAGNOSIS — F200 Paranoid schizophrenia: Secondary | ICD-10-CM | POA: Diagnosis not present

## 2020-01-04 DIAGNOSIS — F1310 Sedative, hypnotic or anxiolytic abuse, uncomplicated: Secondary | ICD-10-CM | POA: Diagnosis not present

## 2020-01-04 DIAGNOSIS — M7662 Achilles tendinitis, left leg: Secondary | ICD-10-CM | POA: Diagnosis not present

## 2020-01-04 DIAGNOSIS — X58XXXA Exposure to other specified factors, initial encounter: Secondary | ICD-10-CM | POA: Diagnosis not present

## 2020-01-04 DIAGNOSIS — Y939 Activity, unspecified: Secondary | ICD-10-CM | POA: Diagnosis not present

## 2020-01-04 DIAGNOSIS — S93402A Sprain of unspecified ligament of left ankle, initial encounter: Secondary | ICD-10-CM | POA: Insufficient documentation

## 2020-01-04 DIAGNOSIS — Y998 Other external cause status: Secondary | ICD-10-CM | POA: Insufficient documentation

## 2020-01-04 DIAGNOSIS — S99912A Unspecified injury of left ankle, initial encounter: Secondary | ICD-10-CM | POA: Diagnosis not present

## 2020-01-04 DIAGNOSIS — J45909 Unspecified asthma, uncomplicated: Secondary | ICD-10-CM | POA: Diagnosis not present

## 2020-01-04 DIAGNOSIS — I959 Hypotension, unspecified: Secondary | ICD-10-CM | POA: Diagnosis not present

## 2020-01-04 DIAGNOSIS — Y929 Unspecified place or not applicable: Secondary | ICD-10-CM | POA: Insufficient documentation

## 2020-01-04 DIAGNOSIS — Z79899 Other long term (current) drug therapy: Secondary | ICD-10-CM | POA: Insufficient documentation

## 2020-01-04 DIAGNOSIS — F209 Schizophrenia, unspecified: Secondary | ICD-10-CM | POA: Diagnosis not present

## 2020-01-04 DIAGNOSIS — Z046 Encounter for general psychiatric examination, requested by authority: Secondary | ICD-10-CM | POA: Diagnosis not present

## 2020-01-04 LAB — COMPREHENSIVE METABOLIC PANEL
ALT: 20 U/L (ref 0–44)
AST: 35 U/L (ref 15–41)
Albumin: 4.9 g/dL (ref 3.5–5.0)
Alkaline Phosphatase: 60 U/L (ref 38–126)
Anion gap: 10 (ref 5–15)
BUN: 15 mg/dL (ref 6–20)
CO2: 25 mmol/L (ref 22–32)
Calcium: 9.4 mg/dL (ref 8.9–10.3)
Chloride: 106 mmol/L (ref 98–111)
Creatinine, Ser: 1.38 mg/dL — ABNORMAL HIGH (ref 0.61–1.24)
GFR calc Af Amer: >60 mL/min (ref >60)
GFR calc non Af Amer: >60 mL/min (ref >60)
Glucose, Bld: 69 mg/dL — ABNORMAL LOW (ref 70–99)
Potassium: 3.6 mmol/L (ref 3.5–5.1)
Sodium: 141 mmol/L (ref 135–145)
Total Bilirubin: 1.4 mg/dL — ABNORMAL HIGH (ref 0.3–1.2)
Total Protein: 7.6 g/dL (ref 6.5–8.1)

## 2020-01-04 LAB — URINE DRUG SCREEN, QUALITATIVE (ARMC ONLY)
Amphetamines, Ur Screen: NOT DETECTED
Barbiturates, Ur Screen: NOT DETECTED
Benzodiazepine, Ur Scrn: POSITIVE — AB
Cannabinoid 50 Ng, Ur ~~LOC~~: NOT DETECTED
Cocaine Metabolite,Ur ~~LOC~~: NOT DETECTED
MDMA (Ecstasy)Ur Screen: NOT DETECTED
Methadone Scn, Ur: NOT DETECTED
Opiate, Ur Screen: NOT DETECTED
Phencyclidine (PCP) Ur S: NOT DETECTED
Tricyclic, Ur Screen: NOT DETECTED

## 2020-01-04 LAB — CBC WITH DIFFERENTIAL/PLATELET
Abs Immature Granulocytes: 0.06 10*3/uL (ref 0.00–0.07)
Basophils Absolute: 0.1 10*3/uL (ref 0.0–0.1)
Basophils Relative: 0 %
Eosinophils Absolute: 0 10*3/uL (ref 0.0–0.5)
Eosinophils Relative: 0 %
HCT: 47.9 % (ref 39.0–52.0)
Hemoglobin: 16.8 g/dL (ref 13.0–17.0)
Immature Granulocytes: 0 %
Lymphocytes Relative: 5 %
Lymphs Abs: 0.7 10*3/uL (ref 0.7–4.0)
MCH: 29.3 pg (ref 26.0–34.0)
MCHC: 35.1 g/dL (ref 30.0–36.0)
MCV: 83.4 fL (ref 80.0–100.0)
Monocytes Absolute: 0.9 10*3/uL (ref 0.1–1.0)
Monocytes Relative: 6 %
Neutro Abs: 12.8 10*3/uL — ABNORMAL HIGH (ref 1.7–7.7)
Neutrophils Relative %: 89 %
Platelets: 276 10*3/uL (ref 150–400)
RBC: 5.74 MIL/uL (ref 4.22–5.81)
RDW: 12.5 % (ref 11.5–15.5)
WBC: 14.5 10*3/uL — ABNORMAL HIGH (ref 4.0–10.5)
nRBC: 0 % (ref 0.0–0.2)

## 2020-01-04 LAB — SALICYLATE LEVEL: Salicylate Lvl: <7 mg/dL — ABNORMAL LOW (ref 7.0–30.0)

## 2020-01-04 LAB — ETHANOL: Alcohol, Ethyl (B): <10 mg/dL (ref <10)

## 2020-01-04 LAB — ACETAMINOPHEN LEVEL: Acetaminophen (Tylenol), Serum: <10 ug/mL — ABNORMAL LOW (ref 10–30)

## 2020-01-04 MED ORDER — ARIPIPRAZOLE 2 MG PO TABS
2.0000 mg | ORAL_TABLET | Freq: Every day | ORAL | Status: DC
  Administered 2020-01-04: 2 mg via ORAL
  Filled 2020-01-04: qty 1

## 2020-01-04 MED ORDER — ARIPIPRAZOLE 2 MG PO TABS: 2.0000 mg | ORAL_TABLET | Freq: Every day | ORAL | 0 refills | Status: DC

## 2020-01-04 NOTE — ED Notes (Signed)
Family at bedside. 
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
## 2020-01-04 NOTE — ED Notes (Signed)
Family back at bedside. MD informed
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
## 2020-01-04 NOTE — ED Provider Notes (Addendum)
[facility] Emergency Department Provider Note  ____________________________________________   First MD Initiated Contact with Patient 01/04/20 1543     (approximate)  I have reviewed the triage vital signs and the nursing notes.   HISTORY  Chief Complaint Paranoid    HPI Michael Vega is a 24 y.o. male with schizophrenia who comes in for psychiatric evaluation.  Patient is been off of his medications.  Family called 911 today due to patient locked himself in the bathroom.  Patient was naked and covered in shampoo.  Patient got 2 mg of Versed and Haldol 5 mg.  Patient is now more calm and cooperative.  Patient states that he just has been feeling stressed and has not been sleeping very well.  He thinks that that is what is causing it.  Patient states that he has weaned off of his Abilify 1 month ago.  Patient denies any chest pain, shortness of breath, fever or any other concerns.  Patient states that he has been more stressed because of his work, constant, nothing makes better, nothing makes it worse          Past Medical History:  Diagnosis Date  . Asthma   . Depression   . Schizophrenia (HCC)   . Schizophrenia, paranoid [facility])     Patient Active Problem List   Diagnosis Date Noted  . Pneumonia 04/03/2019  . MDD (major depressive disorder), recurrent, in full remission (HCC) 03/09/2015  . Paranoid schizophrenia (HCC) 08/04/2014    Past Surgical History:  Procedure Laterality Date  . WISDOM TOOTH EXTRACTION      Prior to Admission medications   Medication Sig Start Date End Date Taking? Authorizing Provider  albuterol (VENTOLIN HFA) 108 (90 Base) MCG/ACT inhaler Inhale 1-2 puffs into the lungs every 6 (six) hours as needed for wheezing or shortness of breath. 04/08/19   Shaune Pollack, MD  ARIPiprazole (ABILIFY) 2 MG tablet Take 1 tablet (2 mg total) by mouth daily. 12/18/19   Oletta Darter, MD  budesonide (PULMICORT) 0.5 MG/2ML nebulizer  solution Inhale into the lungs 2 (two) times daily. 04/11/19 04/10/20  [provider]  budesonide-formoterol (SYMBICORT) 160-4.5 MCG/ACT inhaler Inhale 2 puffs into the lungs in the morning and at bedtime. 11/20/19   [provider]  EPINEPHrine 0.3 mg/0.3 mL IJ SOAJ injection Inject 0.3 mg into the muscle as needed. 08/12/19   [provider]  loratadine (CLARITIN) 10 MG tablet Take 10 mg by mouth daily as needed for allergies.    [provider]    Allergies Patient has no known allergies.  Family History  Problem Relation Age of Onset  . Healthy Mother   . Healthy Father   . Cirrhosis Paternal Grandfather   . ADD / ADHD Neg Hx   . Alcohol abuse Neg Hx   . Anxiety disorder Neg Hx   . Bipolar disorder Neg Hx   . Depression Neg Hx   . Schizophrenia Neg Hx     Social History Social History   Tobacco Use  . Smoking status: Never Smoker  . Smokeless tobacco: Never Used  Substance Use Topics  . Alcohol use: No    Alcohol/week: 0.0 standard drinks  . Drug use: No      Review of Systems Constitutional: No fever/chills Eyes: No visual changes. ENT: No sore throat. Cardiovascular: Denies chest pain. Respiratory: Denies shortness of breath. Gastrointestinal: No abdominal pain.  No nausea, no vomiting.  No diarrhea.  No constipation. Genitourinary: Negative  for dysuria. Musculoskeletal: Negative for back pain. Skin: Negative for rash. Neurological: Negative for headaches, focal weakness or numbness.  Patient reports feeling stressed All other ROS negative ____________________________________________   PHYSICAL EXAM:  VITAL SIGNS: Blood pressure 124/67, pulse 63, temperature 98.8 F (37.1 C), temperature source Oral, resp. rate 18, height 5\' 9"  (1.753 m), weight 104.3 kg, SpO2 96 %.   Constitutional: Alert and oriented. Well appearing and in no acute distress. Eyes: Conjunctivae are normal. EOMI. Head: Atraumatic. Nose: No  congestion/rhinnorhea. Mouth/Throat: Mucous membranes are moist.   Neck: No stridor. Trachea Midline. FROM Cardiovascular: Normal rate, regular rhythm. Grossly normal heart sounds.  Good peripheral circulation. Respiratory: Normal respiratory effort.  No retractions. Lungs CTAB. Gastrointestinal: Soft and nontender. No distention. No abdominal bruits.  Musculoskeletal: No lower extremity tenderness nor edema.  No joint effusions. Neurologic:  Normal speech and language. No gross focal neurologic deficits are appreciated.  Skin:  Skin is warm, dry and intact. No rash noted. Psychiatric: Concern for odd behaviors although patient seems to be more calm now with the medications.  Denies any SI, HI, hallucinations GU: Deferred   ____________________________________________   LABS (all labs ordered are listed, but only abnormal results are displayed)  Labs Reviewed  CBC WITH DIFFERENTIAL/PLATELET - Abnormal; Notable for the following components:      Result Value   WBC 14.5 (*)    Neutro Abs 12.8 (*)    All other components within normal limits  COMPREHENSIVE METABOLIC PANEL - Abnormal; Notable for the following components:   Glucose, Bld 69 (*)    Creatinine, Ser 1.38 (*)    Total Bilirubin 1.4 (*)    All other components within normal limits  RESPIRATORY PANEL BY RT PCR (FLU A&B, COVID)  URINE DRUG SCREEN, QUALITATIVE ([facility] ONLY)  ETHANOL  SALICYLATE LEVEL  ACETAMINOPHEN LEVEL   ____________________________________________     PROCEDURES  Procedure(s) performed (including Critical Care):  Procedures   ____________________________________________   INITIAL IMPRESSION / ASSESSMENT AND PLAN / ED COURSE  Michael Vega was evaluated in Emergency Department on 01/04/2020 for the symptoms described in the history of present illness. He was evaluated in the context of the global COVID-19 pandemic, which necessitated consideration that the patient might be at risk for  infection with the SARS-CoV-2 virus that causes COVID-19. Institutional protocols and algorithms that pertain to the evaluation of patients at risk for COVID-19 are in a state of rapid change based on information released by regulatory bodies including the CDC and federal and state organizations. These policies and algorithms were followed during the patient's care in the ED.    Patient came in IVC by police  Pt is without any acute medical complaints. No exam findings to suggest medical cause of current presentation. Will order psychiatric screening labs and discuss further w/ psychiatric service.  D/d includes but is not limited to psychiatric disease, behavioral/personality disorder, inadequate socioeconomic support, medical.  Based on HPI, exam, unremarkable labs, no concern for acute medical problem at this time. No rigidity, clonus, hyperthermia, focal neurologic deficit, diaphoresis, tachycardia, meningismus, ataxia, gait abnormality or other finding to suggest this visit represents a non-psychiatric problem. Screening labs reviewed.    Given this, pt medically cleared, to be dispositioned per Psych.  Patient does have a white count elevation but he denies any infectious symptoms.  Kidney function slightly elevated at 1.38 as well.  The patient has been placed in psychiatric observation due to the need to provide a safe environment for the  patient while obtaining psychiatric consultation and evaluation, as well as ongoing medical and medication management to treat the patient's condition.  The patient has been placed under full IVC at this time.  Patient was seen by psychiatric team.  IVC was rescinded and patient will be restarted on Abilify and a prescription was provided to family.  Mother is okay with taking patient back home.  Patient concerned that he is having some left ankle pain.  Will get some x-rays.  Patient does have some pain on his Achilles tendon but denies feeling it ruptured.   Patient has limited motion secondary to pain but has a normal Thompson test.  Will place patient in a walking boot given he could have like a partial Achilles injury.  Patient given orthopedic follow-up as well if not getting better and crutches.   ____________________________________________   FINAL CLINICAL IMPRESSION(S) / ED DIAGNOSES   Final diagnoses:  Schizophrenia, unspecified type (HCC)  Evaluation by psychiatric service required      MEDICATIONS GIVEN DURING THIS VISIT:  Medications - No data to display   ED Discharge Orders    None       Note:  This document was prepared using Dragon voice recognition software and may include unintentional dictation errors.   Concha Se, MD 01/04/20 1730    Concha Se, MD 01/04/20 2014

## 2020-01-04 NOTE — Discharge Instructions (Addendum)
He can be in the boot and use crutches as needed.  It seems like his Achilles tendon is intact but if he is still having pain after 1 to 2 weeks he can follow-up with the orthopedic doctors.

## 2020-01-04 NOTE — ED Notes (Signed)
Blood has been collected and provided pt with purple scrubs and an urine cup. Explained pt hospital procedures. Pt is pleasant and being cooperative.

## 2020-01-04 NOTE — ED Notes (Signed)
Report to include Situation, Background, Assessment, and Recommendations received from [facility]. Patient alert, warm and dry, in no acute distress. Mom at bedside since patient to be discharged as soon as Dr. Fuller Plan talks to them. Direct observation by Research scientist (medical) as well as q15 minute rounds to continue.

## 2020-01-04 NOTE — ED Notes (Signed)
Pt waiting on mom to come back and speak with EDP.

## 2020-01-04 NOTE — ED Notes (Signed)
Hourly rounding reveals patient awake in room with Mother at bedside. No complaints, stable, in no acute distress. Q15 minute rounds and monitoring via Psychologist, counselling to continue.

## 2020-01-04 NOTE — Consult Note (Signed)
[facility] Face-to[facility]Face [facility]iat[facility] Consult   Reason for Consult:  Aggression and shizophrenia Referring Physician:  EDP Patient Identification: Michael Vega MRN:  [id number] Principal Diagnosis: Paranoid schizophrenia (HCC) Diagnosis:  Principal Problem:   Paranoid schizophrenia (HCC)   Total Time spent with patient: 30 minutes  Subjective:   Michael Vega is a 24 y.o. male patient reports that he has had a lot of stressors recently at home.  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  He reports that between school and his shoe business he has been extremely overwhelmed and has not been doing well as far as stress.  He denies any suicidal or homicidal ideations and denies any hallucinations.  He states that today he was starting to get in the shower and just felt agitated and became upset.  He reports that he did not assault anyone and was not aggressive towards anyone and that he came to the hospital to get some help.  He reports that he was supposed to be taking Abilify 2 mg p.o. daily, but he ran out of his prescription and he could not get in touch with the outpatient office to get the medication refilled and so he stopped using it.  Once the medication was refilled he felt that he was doing better without it and so he has not taken it in almost a month.  Patient's mother is at the bedside.  She confirms that the patient is full-time school but online and that he does have a shoe business.  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  She states that he usually stays very busy.  She also reports that there was not any type of aggressive behavior or salt at the house today.  She reports that the patient did have numerous police officers there at the house because he had became upset but it was a nonaggressive event and that the patient and the police officers walked  out together and brought the patient to the hospital.  She also reports that the patient does not appear to have been suicidal nor homicidal and has not shown any symptoms of hallucinations.  She is in agreement with restarting the patient on the Abilify 2 mg p.o. daily and feels safe with him discharging home.  She also states that she is in agreement with getting him a [facility] for him to continue his medications.  HPI:  Per EDP:  24 y.o. male with schizophrenia who comes in for psychiatric evaluation.  Patient is been off of his medications.  Family called 911 today due to patient locked himself in the bathroom.  Patient was naked and covered in shampoo.  Patient got 2 mg of Versed and Haldol 5 mg.  Patient is now more calm and cooperative.  Patient states that he just has been feeling stressed and has not been sleeping very well.  He thinks that that is what is causing it.  Patient states that he has weaned off of his Abilify 1 month ago.  Patient denies any chest pain, shortness of breath, fever or any other concerns.  Patient states that he has been more stressed because of his work, constant, nothing makes better, nothing makes it worse  Patient is seen by this provider via face-to-face.  Patient and patient's mother both describe the  same story with concerns of the patient being off of his medications.  The patient has denied any suicidal homicidal ideations and denies any hallucinations and patient's mom feels that the patient is safe to discharge with continuing his medications.  The patient was calm, cooperative, and pleasant during the evaluation and is showing no signs of aggressive or agitated behavior while he has been in the ED.  Patient does not appear to be psychotic or showing any signs of responding to internal stimuli.  At this time the patient does not meet inpatient psychiatric treatment and psychiatric cleared.  I have rescinded the patient's  IVC and I have notified Dr. Fuller Plan of the recommendations.  Past Psychiatric History: Paranoid schizophrenia, MDD recurrent, current outpatient providers with [facility]  Risk to Self:   Risk to Others:   Prior Inpatient Therapy:   Prior Outpatient Therapy:    Past Medical History:  Past Medical History:  Diagnosis Date  . Asthma   . Depression   . Schizophrenia (HCC)   . Schizophrenia, paranoid [facility])     Past Surgical History:  Procedure Laterality Date  . WISDOM TOOTH EXTRACTION     Family History:  Family History  Problem Relation Age of Onset  . Healthy Mother   . Healthy Father   . Cirrhosis Paternal Grandfather   . ADD / ADHD Neg Hx   . Alcohol abuse Neg Hx   . Anxiety disorder Neg Hx   . Bipolar disorder Neg Hx   . Depression Neg Hx   . Schizophrenia Neg Hx    Family Psychiatric  History: None reported Social History:  Social History   Substance and Sexual Activity  Alcohol Use No  . Alcohol/week: 0.0 standard drinks     Social History   Substance and Sexual Activity  Drug Use No    Social History   Socioeconomic History  . Marital status: Single    Spouse name: Not on file  . Number of children: Not on file  . Years of education: Not on file  . Highest education level: Not on file  Occupational History  . Not on file  Tobacco Use  . Smoking status: Never Smoker  . Smokeless tobacco: Never Used  Substance and Sexual Activity  . Alcohol use: No    Alcohol/week: 0.0 standard drinks  . Drug use: No  . Sexual activity: Not Currently  Other Topics Concern  . Not on file  Social History Narrative  . Not on file   Social Determinants of Health   Financial Resource Strain:   . Difficulty of Paying Living Expenses:   Food Insecurity:   . Worried About Programme researcher, broadcasting/film/video in the Last Year:   . Barista in the Last Year:   Transportation Needs:   . Freight forwarder (Medical):   Marland Kitchen Lack of Transportation  (Non-Medical):   Physical Activity:   . Days of Exercise per Week:   . Minutes of Exercise per Session:   Stress:   . Feeling of Stress :   Social Connections:   . Frequency of Communication with Friends and Family:   . Frequency of Social Gatherings with Friends and Family:   . Attends Religious Services:   . Active Member of Clubs or Organizations:   . Attends Banker Meetings:   Marland Kitchen Marital Status:    Additional Social History:    Allergies:  No Known Allergies  Labs:  Results for orders  placed or performed during the hospital encounter of 01/04/20 (from the past 48 hour(s))  CBC with Differential     Status: Abnormal   Collection Time: 01/04/20  4:56 PM  Result Value Ref Range   WBC 14.5 (H) 4.0 - 10.5 K/uL   RBC 5.74 4.22 - 5.81 MIL/uL   Hemoglobin 16.8 13.0 - 17.0 g/dL   HCT 72.5 36.6 - 44.0 %   MCV 83.4 80.0 - 100.0 fL   MCH 29.3 26.0 - 34.0 pg   MCHC 35.1 30.0 - 36.0 g/dL   RDW 34.7 42.5 - 95.6 %   Platelets 276 150 - 400 K/uL   nRBC 0.0 0.0 - 0.2 %   Neutrophils Relative % 89 %   Neutro Abs 12.8 (H) 1.7 - 7.7 K/uL   Lymphocytes Relative 5 %   Lymphs Abs 0.7 0.7 - 4.0 K/uL   Monocytes Relative 6 %   Monocytes Absolute 0.9 0.1 - 1.0 K/uL   Eosinophils Relative 0 %   Eosinophils Absolute 0.0 0.0 - 0.5 K/uL   Basophils Relative 0 %   Basophils Absolute 0.1 0.0 - 0.1 K/uL   Immature Granulocytes 0 %   Abs Immature Granulocytes 0.06 0.00 - 0.07 K/uL    Comment: Performed at [facility], [address] [place], [place] [address]  Comprehensive metabolic panel     Status: Abnormal   Collection Time: 01/04/20  4:56 PM  Result Value Ref Range   Sodium 141 135 - 145 mmol/L   Potassium 3.6 3.5 - 5.1 mmol/L   Chloride 106 98 - 111 mmol/L   CO2 25 22 - 32 mmol/L   Glucose, Bld 69 (L) 70 - 99 mg/dL    Comment: Glucose reference range applies only to samples taken after fasting for at least 8 hours.   BUN 15 6 - 20 mg/dL   Creatinine, Ser 4.33  (H) 0.61 - 1.24 mg/dL   Calcium 9.4 8.9 - 29.5 mg/dL   Total Protein 7.6 6.5 - 8.1 g/dL   Albumin 4.9 3.5 - 5.0 g/dL   AST 35 15 - 41 U/L   ALT 20 0 - 44 U/L   Alkaline Phosphatase 60 38 - 126 U/L   Total Bilirubin 1.4 (H) 0.3 - 1.2 mg/dL   GFR calc non Af Amer >60 >60 mL/min   GFR calc Af Amer >60 >60 mL/min   Anion gap 10 5 - 15    Comment: Performed at [facility], [address], [place], [place] [address]  Urine Drug Screen, Qualitative ([facility] only)     Status: Abnormal   Collection Time: 01/04/20  4:56 PM  Result Value Ref Range   Tricyclic, Ur Screen NONE DETECTED NONE DETECTED   Amphetamines, Ur Screen NONE DETECTED NONE DETECTED   MDMA (Ecstasy)Ur Screen NONE DETECTED NONE DETECTED   Cocaine Metabolite,Ur [place] NONE DETECTED NONE DETECTED   Opiate, Ur Screen NONE DETECTED NONE DETECTED   Phencyclidine (PCP) Ur S NONE DETECTED NONE DETECTED   Cannabinoid 50 Ng, Ur  NONE DETECTED NONE DETECTED   Barbiturates, Ur Screen NONE DETECTED NONE DETECTED   Benzodiazepine, Ur Scrn POSITIVE (A) NONE DETECTED   Methadone Scn, Ur NONE DETECTED NONE DETECTED    Comment: (NOTE) Tricyclics + metabolites, urine    Cutoff 1000 ng/mL Amphetamines + metabolites, urine  Cutoff 1000 ng/mL MDMA (Ecstasy), urine              Cutoff 500 ng/mL Cocaine Metabolite, urine  Cutoff 300 ng/mL Opiate + metabolites, urine        Cutoff 300 ng/mL Phencyclidine (PCP), urine         Cutoff 25 ng/mL Cannabinoid, urine                 Cutoff 50 ng/mL Barbiturates + metabolites, urine  Cutoff 200 ng/mL Benzodiazepine, urine              Cutoff 200 ng/mL Methadone, urine                   Cutoff 300 ng/mL The urine drug screen provides only a preliminary, unconfirmed analytical test result and should not be used for non-medical purposes. Clinical consideration and professional judgment should be applied to any positive drug screen result due to possible interfering substances. A more specific  alternate chemical method must be used in order to obtain a confirmed analytical result. Gas chromatography / mass spectrometry (GC/MS) is the preferred confirmat ory method. Performed at [facility], [address] [place], [place] [address]   Ethanol     Status: None   Collection Time: 01/04/20  4:56 PM  Result Value Ref Range   Alcohol, Ethyl (B) <10 <10 mg/dL    Comment: (NOTE) Lowest detectable limit for serum alcohol is 10 mg/dL. For medical purposes only. Performed at [facility], [address] [place], [place] [address]   Salicylate level     Status: Abnormal   Collection Time: 01/04/20  4:56 PM  Result Value Ref Range   Salicylate Lvl <7.0 (L) 7.0 - 30.0 mg/dL    Comment: Performed at [facility], [address] [place], [place] [address]  Acetaminophen level     Status: Abnormal   Collection Time: 01/04/20  4:56 PM  Result Value Ref Range   Acetaminophen (Tylenol), Serum <10 (L) 10 - 30 ug/mL    Comment: (NOTE) Therapeutic concentrations vary significantly. A range of 10-30 ug/mL  may be an effective concentration for many patients. However, some  are best treated at concentrations outside of this range. Acetaminophen concentrations >150 ug/mL at 4 hours after ingestion  and >50 ug/mL at 12 hours after ingestion are often associated with  toxic reactions. Performed at [facility], [address], [place], [place] [address]     Current Facility-Administered Medications  Medication Dose Route Frequency Provider Last Rate Last Admin  . ARIPiprazole (ABILIFY) tablet 2 mg  2 mg Oral Daily Korie Streat, Gerlene Burdock, FNP       Current Outpatient Medications  Medication Sig Dispense Refill  . budesonide (PULMICORT) 0.5 MG/2ML nebulizer solution Inhale into the lungs 2 (two) times daily.    . budesonide-formoterol (SYMBICORT) 160-4.5 MCG/ACT inhaler Inhale 2 puffs into the lungs in the morning and at bedtime.    Marland Kitchen EPINEPHrine 0.3  mg/0.3 mL IJ SOAJ injection Inject 0.3 mg into the muscle as needed.    . loratadine (CLARITIN) 10 MG tablet Take 10 mg by mouth daily as needed for allergies.    . ARIPiprazole (ABILIFY) 2 MG tablet Take 1 tablet (2 mg total) by mouth daily. 30 tablet 0    Musculoskeletal: Strength & Muscle Tone: within normal limits Gait & Station: normal Patient leans: N/A  Psychiatric Specialty Exam: Physical Exam  Nursing note and vitals reviewed. Constitutional: He is oriented to person, place, and time. He appears well-developed and well-nourished.  Cardiovascular: Normal rate.  Respiratory: Effort normal.  Musculoskeletal:  General: Normal range of motion.  Neurological: He is alert and oriented to person, place, and time.  Skin: Skin is warm.    Review of Systems  Constitutional: Negative.   HENT: Negative.   Eyes: Negative.   Respiratory: Negative.   Cardiovascular: Negative.   Gastrointestinal: Negative.   Genitourinary: Negative.   Musculoskeletal: Negative.   Skin: Negative.   Neurological: Negative.   Psychiatric/Behavioral: Negative.     Blood pressure 124/67, pulse 63, temperature 98.8 F (37.1 C), temperature source Oral, resp. rate 18, height 5\' 9"  (1.753 m), weight 104.3 kg, SpO2 96 %.Body mass index is 33.97 kg/m.  General Appearance: Casual  Eye Contact:  Good  Speech:  Clear and Coherent and Normal Rate  Volume:  Normal  Mood:  Euthymic  Affect:  Congruent  Thought Process:  Coherent and Descriptions of Associations: Intact  Orientation:  Full (Time, Place, and Person)  Thought Content:  WDL  Suicidal Thoughts:  No  Homicidal Thoughts:  No  Memory:  Immediate;   Good Recent;   Good Remote;   Good  Judgement:  Fair  Insight:  Fair  Psychomotor Activity:  Normal  Concentration:  Concentration: Good  Recall:  Good  Fund of Knowledge:  Fair  Language:  Good  Akathisia:  No  Handed:  Right  AIMS (if indicated):     Assets:  Communication  Skills Desire for Improvement Financial Resources/Insurance Housing Physical Health Social Support Transportation  ADL's:  Intact  Cognition:  WNL  Sleep:        Treatment Plan Summary: Medication management  Follow-up with outpatient provider at [facility] Restart Abilify 2 mg p.o. daily and provided patient with 30-day prescription  Disposition: No evidence of imminent risk to self or others at present.   Patient does not meet criteria for psychiatric inpatient admission. Discussed crisis plan, support from social network, calling 911, coming to the Emergency Department, and calling Suicide Hotline.  [place], FNP 01/04/2020 6:18 PM

## 2020-01-04 NOTE — ED Notes (Signed)
Pt given street clothes to get dressed into. Pt informed of waiting on medication then d/c home with mom.

## 2020-01-04 NOTE — ED Notes (Signed)
Walking boot and crutches with instructions per Dr. Fuller Plan.

## 2020-01-04 NOTE — ED Notes (Signed)
Pt changed to purple scrubs. UA was sent to lab.  Personal belongings placed in a pt bag.  no sleeves gray tshirt, dark blue short, white socks, and white/blue nike sandals.  Pt has a pair of black frame glasses.

## 2020-01-05 ENCOUNTER — Telehealth (HOSPITAL_COMMUNITY): Payer: Self-pay

## 2020-01-05 DIAGNOSIS — F4322 Adjustment disorder with anxiety: Secondary | ICD-10-CM | POA: Diagnosis not present

## 2020-01-05 NOTE — Telephone Encounter (Signed)
Received 90 day rx request for abilify. Prescription for 30 day from [facility] sent 01/04/20 after pt visit to ED. Per notes, unclear if pt has been taking this prescription. Next appointment 4/15. Please review and advise.

## 2020-01-07 ENCOUNTER — Ambulatory Visit: Payer: Federal, State, Local not specified - PPO | Attending: Internal Medicine

## 2020-01-07 DIAGNOSIS — Z20822 Contact with and (suspected) exposure to covid-19: Secondary | ICD-10-CM | POA: Diagnosis not present

## 2020-01-08 ENCOUNTER — Ambulatory Visit (HOSPITAL_COMMUNITY): Payer: Federal, State, Local not specified - PPO | Admitting: Psychiatry

## 2020-01-08 ENCOUNTER — Other Ambulatory Visit: Payer: Self-pay

## 2020-01-08 LAB — NOVEL CORONAVIRUS, NAA: SARS-CoV-2, NAA: NOT DETECTED

## 2020-01-08 LAB — SARS-COV-2, NAA 2 DAY TAT

## 2020-01-08 MED ORDER — ARIPIPRAZOLE 2 MG PO TABS: 4.0000 mg | ORAL_TABLET | Freq: Every day | ORAL | 0 refills | Status: DC
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
## 2020-01-08 NOTE — Progress Notes (Signed)
MyChart Video Visit     Virtual Visit via Video Note   This visit type was conducted due to national recommendations for restrictions regarding the COVID-19 Pandemic (e.g. social distancing) in an effort to limit this patient's exposure and mitigate transmission in our community. This patient is at least at moderate risk for complications without adequate follow up. This format is felt to be most appropriate for this patient at this time. Physical exam was limited by quality of the video and audio technology used for the visit.   Patient location: Home Provider location: office   Patient: Michael Vega   DOB: 1996/04/23   23 y.o. Male  MRN: [id number] Visit Date: 01/09/2020  Today's Provider: Trey Sailors, PA-C  Subjective:    Chief Complaint  Patient presents with  . Foot Pain   Leg Pain  Incident onset: 6 days ago. The pain is present in the left foot.    Patient reports he slipped and fell in the shower on 01/03/2020. He had psyciatric episode and was seen in [facility] During this time, he got a left foot xray and left ankle xray which were negative for fracture. He reports he received a walking boot and crutches in the hospital. He has used ice which was helpful to him and also anti-inflammatories which were also helpful to him. He was able to walk lightly on the treadmill today. Right foot is normal and unaffected. No prior injuries of left ankle     Medications: Outpatient Medications Prior to Visit  Medication Sig  . ARIPiprazole (ABILIFY) 2 MG tablet Take 2 tablets (4 mg total) by mouth daily.  . budesonide (PULMICORT) 0.5 MG/2ML nebulizer solution Inhale into the lungs 2 (two) times daily.  . budesonide-formoterol (SYMBICORT) 160-4.5 MCG/ACT inhaler Inhale 2 puffs into the lungs in the morning and at bedtime.  Marland Kitchen EPINEPHrine 0.3 mg/0.3 mL IJ SOAJ injection Inject 0.3 mg into the muscle as needed.  . loratadine (CLARITIN) 10 MG tablet Take 10 mg by mouth daily as  needed for allergies.   No facility-administered medications prior to visit.      Review of Systems  Constitutional: Negative.   Respiratory: Negative.   Cardiovascular: Negative.   Hematological: Negative.         Objective:    There were no vitals taken for this visit.   Physical Exam       Assessment & Plan:    1. Acute left ankle pain  Suspect sprain after fall in shower. Reviewed left ankle and left foot xray from 01/04/2020 personally and I agree with the findings. Advised on NSAIDs, ice and activity as tolerated. Counseled on likely 4-6 week course of healing. Referral to orthopedics placed at patient's request.   - meloxicam (MOBIC) 7.5 MG tablet; Take 1 tablet (7.5 mg total) by mouth daily.  Dispense: 30 tablet; Refill: 0 - Ambulatory referral to Orthopedics  I, Trey Sailors, PA-C, have reviewed all documentation for this visit. The documentation on 01/09/20 for the exam, diagnosis, procedures, and orders are all accurate and complete.   No follow-ups on file.     I discussed the assessment and treatment plan with the patient. The patient was provided an opportunity to ask questions and all were answered. The patient agreed with the plan and demonstrated an understanding of the instructions.   The patient was advised to call back or seek an in-person evaluation if the symptoms worsen or if the condition fails to improve as  anticipated.  I provided 15 minutes of non-face-to-face time during this encounter.    Paulene Floor [facility] [phone] (phone) [phone] (fax)  [place]

## 2020-01-09 ENCOUNTER — Ambulatory Visit (INDEPENDENT_AMBULATORY_CARE_PROVIDER_SITE_OTHER): Payer: Federal, State, Local not specified - PPO | Admitting: Physician Assistant

## 2020-01-09 DIAGNOSIS — M25572 Pain in left ankle and joints of left foot: Secondary | ICD-10-CM

## 2020-01-09 MED ORDER — MELOXICAM 7.5 MG PO TABS: 7.5000 mg | ORAL_TABLET | Freq: Every day | ORAL | 0 refills | Status: DC

## 2020-01-09 NOTE — Patient Instructions (Signed)
Ankle Pain The ankle joint holds your body weight and allows you to move around. Ankle pain can occur on either side or the back of one ankle or both ankles. Ankle pain may be sharp and burning or dull and aching. There may be tenderness, stiffness, redness, or warmth around the ankle. Many things can cause ankle pain, including an injury to the area and overuse of the ankle. Follow these instructions at home: Activity  Rest your ankle as told by your health care provider. Avoid any activities that cause ankle pain.  Do not use the injured limb to support your body weight until your health care provider says that you can. Use crutches as told by your health care provider.  Do exercises as told by your health care provider.  Ask your health care provider when it is safe to drive if you have a brace on your ankle. If you have a brace:  Wear the brace as told by your health care provider. Remove it only as told by your health care provider.  Loosen the brace if your toes tingle, become numb, or turn cold and blue.  Keep the brace clean.  If the brace is not waterproof: ? Do not let it get wet. ? Cover it with a watertight covering when you take a bath or shower. If you were given an elastic bandage:   Remove it when you take a bath or a shower.  Try not to move your ankle very much, but wiggle your toes from time to time. This helps to prevent swelling.  Adjust the bandage to make it more comfortable if it feels too tight.  Loosen the bandage if you have numbness or tingling in your foot or if your foot turns cold and blue. Managing pain, stiffness, and swelling   If directed, put ice on the painful area. ? If you have a removable brace or elastic bandage, remove it as told by your health care provider. ? Put ice in a plastic bag. ? Place a towel between your skin and the bag. ? Leave the ice on for 20 minutes, 2-3 times a day.  Move your toes often to avoid stiffness and to  lessen swelling.  Raise (elevate) your ankle above the level of your heart while you are sitting or lying down. General instructions  Record information about your pain. Writing down the following may be helpful for you and your health care provider: ? How often you have ankle pain. ? Where the pain is located. ? What the pain feels like.  If treatment involves wearing a prescribed shoe or insole, make sure you wear it correctly and for as long as told by your health care provider.  Take over-the-counter and prescription medicines only as told by your health care provider.  Keep all follow-up visits as told by your health care provider. This is important. Contact a health care provider if:  Your pain gets worse.  Your pain is not relieved with medicines.  You have a fever or chills.  You are having more trouble with walking.  You have new symptoms. Get help right away if:  Your foot, leg, toes, or ankle: ? Tingles or becomes numb. ? Becomes swollen. ? Turns pale or blue. Summary  Ankle pain can occur on either side or the back of one ankle or both ankles.  Ankle pain may be sharp and burning or dull and aching.  Rest your ankle as told by your health care provider.   If told, apply ice to the area.  Take over-the-counter and prescription medicines only as told by your health care provider. This information is not intended to replace advice given to you by your health care provider. Make sure you discuss any questions you have with your health care provider. Document Revised: 12/31/2018 Document Reviewed: 03/20/2018 Elsevier Patient Education  2020 Elsevier Inc.  

## 2020-01-12 DIAGNOSIS — F4322 Adjustment disorder with anxiety: Secondary | ICD-10-CM | POA: Diagnosis not present

## 2020-01-19 DIAGNOSIS — F4322 Adjustment disorder with anxiety: Secondary | ICD-10-CM | POA: Diagnosis not present

## 2020-01-21 ENCOUNTER — Other Ambulatory Visit: Payer: Self-pay | Admitting: Physician Assistant

## 2020-01-21 ENCOUNTER — Other Ambulatory Visit (HOSPITAL_COMMUNITY): Payer: Self-pay | Admitting: *Deleted

## 2020-01-21 ENCOUNTER — Other Ambulatory Visit (HOSPITAL_COMMUNITY): Payer: Self-pay

## 2020-01-21 DIAGNOSIS — M25572 Pain in left ankle and joints of left foot: Secondary | ICD-10-CM

## 2020-01-21 MED ORDER — ARIPIPRAZOLE 2 MG PO TABS: 4.0000 mg | ORAL_TABLET | Freq: Every day | ORAL | 0 refills | Status: DC

## 2020-01-22 ENCOUNTER — Telehealth (INDEPENDENT_AMBULATORY_CARE_PROVIDER_SITE_OTHER): Payer: Federal, State, Local not specified - PPO | Admitting: Psychiatry

## 2020-01-22 ENCOUNTER — Encounter (HOSPITAL_COMMUNITY): Payer: Self-pay | Admitting: Psychiatry

## 2020-01-22 ENCOUNTER — Other Ambulatory Visit: Payer: Self-pay

## 2020-01-22 DIAGNOSIS — F251 Schizoaffective disorder, depressive type: Secondary | ICD-10-CM

## 2020-01-22 MED ORDER — ARIPIPRAZOLE 2 MG PO TABS: 4.0000 mg | ORAL_TABLET | Freq: Every day | ORAL | 0 refills | Status: DC

## 2020-01-22 NOTE — BH Specialist Note (Signed)
Virtual Visit via Telephone Note  I connected with [name] on 01/22/20 at  9:00 AM EDT by telephone and verified that I am speaking with the correct person using two identifiers.  Location: Patient: home Provider: office   I discussed the limitations, risks, security and privacy concerns of performing an evaluation and management service by telephone and the availability of in person appointments. I also discussed with the patient that there may be a patient responsible charge related to this service. The patient expressed understanding and agreed to proceed.   History of Present Illness: Kush states he is doing much better. He is no longer feeling hypomanic. He is denying depression. Sleep and appetite are good. He is active but not overly energetic. He is denying manic or hypomanic like symptoms. He denies SI/HI. He denies AVH and paranoia.    Observations/Objective:  General Appearance: unable to assess  Eye Contact:  unable to assess  Speech:  Clear and Coherent and Normal Rate  Volume:  Normal  Mood:  Euthymic  Affect:  Full Range  Thought Process:  Goal Directed, Linear and Descriptions of Associations: Intact  Orientation:  Full (Time, Place, and Person)  Thought Content:  Logical  Suicidal Thoughts:  No  Homicidal Thoughts:  No  Memory:  Immediate;   Good  Judgement:  Good  Insight:  Good  Psychomotor Activity: unable to assess  Concentration:  Concentration: Good  Recall:  Good  Fund of Knowledge:  Good  Language:  Good  Akathisia:  unable to assess  Handed:  Right  AIMS (if indicated):     Assets:  Communication Skills Desire for Improvement Financial Resources/Insurance Housing Leisure Time Resilience Social Support Talents/Skills Transportation Vocational/Educational  ADL's:  unable to assess  Cognition:  WNL  Sleep:         Assessment and Plan: Schizoaffective disorder- depressed type; r/o MDD with psychotic features  Continue Abilify  4mg  po qD    Follow Up Instructions: In 2-3 months or sooner if needed   I discussed the assessment and treatment plan with the patient. The patient was provided an opportunity to ask questions and all were answered. The patient agreed with the plan and demonstrated an understanding of the instructions.   The patient was advised to call back or seek an in-person evaluation if the symptoms worsen or if the condition fails to improve as anticipated.  I provided 15  minutes of non-face-to-face time during this encounter.   , MD

## 2020-02-09 DIAGNOSIS — F4322 Adjustment disorder with anxiety: Secondary | ICD-10-CM | POA: Diagnosis not present

## 2020-02-16 DIAGNOSIS — F4322 Adjustment disorder with anxiety: Secondary | ICD-10-CM | POA: Diagnosis not present

## 2020-02-19 ENCOUNTER — Encounter (HOSPITAL_COMMUNITY): Payer: Self-pay | Admitting: Psychiatry

## 2020-02-19 ENCOUNTER — Telehealth (INDEPENDENT_AMBULATORY_CARE_PROVIDER_SITE_OTHER): Payer: Federal, State, Local not specified - PPO | Admitting: Psychiatry

## 2020-02-19 ENCOUNTER — Other Ambulatory Visit: Payer: Self-pay

## 2020-02-19 DIAGNOSIS — F251 Schizoaffective disorder, depressive type: Secondary | ICD-10-CM

## 2020-02-19 MED ORDER — ARIPIPRAZOLE 2 MG PO TABS: 4.0000 mg | ORAL_TABLET | Freq: Every day | ORAL | 0 refills | Status: DC
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
## 2020-03-08 DIAGNOSIS — F4322 Adjustment disorder with anxiety: Secondary | ICD-10-CM | POA: Diagnosis not present

## 2020-04-01 DIAGNOSIS — F4322 Adjustment disorder with anxiety: Secondary | ICD-10-CM | POA: Diagnosis not present

## 2020-04-02 ENCOUNTER — Ambulatory Visit: Payer: Federal, State, Local not specified - PPO

## 2020-04-02 ENCOUNTER — Other Ambulatory Visit: Payer: Self-pay

## 2020-04-05 ENCOUNTER — Ambulatory Visit: Payer: Federal, State, Local not specified - PPO | Attending: Internal Medicine

## 2020-04-05 DIAGNOSIS — Z20822 Contact with and (suspected) exposure to covid-19: Secondary | ICD-10-CM | POA: Diagnosis not present

## 2020-04-06 LAB — SARS-COV-2, NAA 2 DAY TAT

## 2020-04-06 LAB — NOVEL CORONAVIRUS, NAA: SARS-CoV-2, NAA: NOT DETECTED

## 2020-04-22 DIAGNOSIS — F4322 Adjustment disorder with anxiety: Secondary | ICD-10-CM | POA: Diagnosis not present

## 2020-05-04 ENCOUNTER — Other Ambulatory Visit (HOSPITAL_COMMUNITY): Payer: Self-pay | Admitting: Psychiatry

## 2020-05-17 DIAGNOSIS — F4322 Adjustment disorder with anxiety: Secondary | ICD-10-CM | POA: Diagnosis not present

## 2020-05-20 ENCOUNTER — Telehealth (HOSPITAL_COMMUNITY): Payer: Federal, State, Local not specified - PPO | Admitting: Psychiatry

## 2020-05-20 ENCOUNTER — Other Ambulatory Visit: Payer: Self-pay

## 2020-06-03 ENCOUNTER — Other Ambulatory Visit (HOSPITAL_COMMUNITY): Payer: Self-pay | Admitting: Psychiatry

## 2020-06-07 DIAGNOSIS — F4322 Adjustment disorder with anxiety: Secondary | ICD-10-CM | POA: Diagnosis not present

## 2020-06-14 ENCOUNTER — Other Ambulatory Visit: Payer: Self-pay | Admitting: Physician Assistant

## 2020-06-14 DIAGNOSIS — M25572 Pain in left ankle and joints of left foot: Secondary | ICD-10-CM

## 2020-06-22 ENCOUNTER — Other Ambulatory Visit (HOSPITAL_COMMUNITY): Payer: Self-pay | Admitting: Psychiatry

## 2020-06-24 ENCOUNTER — Other Ambulatory Visit: Payer: Self-pay

## 2020-06-24 ENCOUNTER — Telehealth (INDEPENDENT_AMBULATORY_CARE_PROVIDER_SITE_OTHER): Payer: Federal, State, Local not specified - PPO | Admitting: Psychiatry

## 2020-06-24 ENCOUNTER — Encounter (HOSPITAL_COMMUNITY): Payer: Self-pay | Admitting: Psychiatry

## 2020-06-24 DIAGNOSIS — F251 Schizoaffective disorder, depressive type: Secondary | ICD-10-CM

## 2020-06-24 MED ORDER — ARIPIPRAZOLE 2 MG PO TABS: 4.0000 mg | ORAL_TABLET | Freq: Every day | ORAL | 1 refills | Status: DC

## 2020-06-24 NOTE — Progress Notes (Signed)
Virtual Visit via Telephone Note  I connected with [name] on 06/24/20 at 10:30 AM EDT by telephone and verified that I am speaking with the correct person using two identifiers.  Location: Patient: car Provider: office   I discussed the limitations, risks, security and privacy concerns of performing an evaluation and management service by telephone and the availability of in person appointments. I also discussed with the patient that there may be a patient responsible charge related to this service. The patient expressed understanding and agreed to proceed.   History of Present Illness: Michael Vega reports he is doing well. He is turning 24 next week. "I am in a good space. I feel good mentally and emotionally". He denies any depression and anxiety symptoms. He is sleeping well. He denies paranoia and ideas of reference. [name] denies AVH. He denies SI/HI. His medication is working well and he denies SE.    Observations/Objective:  General Appearance: unable to assess  Eye Contact:  unable to assess  Speech:  Clear and Coherent and Normal Rate  Volume:  Normal  Mood:  Euthymic  Affect:  Full Range  Thought Process:  Goal Directed, Linear and Descriptions of Associations: Intact  Orientation:  Full (Time, Place, and Person)  Thought Content:  Logical  Suicidal Thoughts:  No  Homicidal Thoughts:  No  Memory:  Immediate;   Good  Judgement:  Good  Insight:  Good  Psychomotor Activity: unable to assess  Concentration:  Concentration: Good  Recall:  Good  Fund of Knowledge:  Good  Language:  Good  Akathisia:  unable to assess  Handed:  Right  AIMS (if indicated):     Assets:  Communication Skills Desire for Improvement Financial Resources/Insurance Housing Leisure Time Physical Health Resilience Social Support Talents/Skills Transportation Vocational/Educational  ADL's:  unable to assess  Cognition:  WNL  Sleep:         Assessment and Plan:  Schizoaffective  disorder- depressed type  Abilify 4mg  po qD  reviewed labs 01/04/20 creatinine 1.38; UDS +benzo-- pt denies any use. I recommended pt set up appointment with PCP to review creatinine levels. [name] stated he would call to set up the visit today.   Follow Up Instructions: in 5-6 months or sooner if needed   I discussed the assessment and treatment plan with the patient. The patient was provided an opportunity to ask questions and all were answered. The patient agreed with the plan and demonstrated an understanding of the instructions.   The patient was advised to call back or seek an in-person evaluation if the symptoms worsen or if the condition fails to improve as anticipated.  I provided 10 minutes of non-face-to-face time during this encounter.   Casimiro Needle, MD

## 2020-07-29 IMAGING — CT CT ANGIOGRAPHY CHEST
2 of 6 series · 19 of 36 positions shown · IV contrast (APPLIED)
Comparison: None.

CLINICAL DATA: Fever, cough, body aches

EXAM:
CT ANGIOGRAPHY CHEST WITH CONTRAST
TECHNIQUE: Multidetector CT imaging of the chest was performed using the
standard protocol during bolus administration of intravenous
contrast. Multiplanar CT image reconstructions and MIPs were
obtained to evaluate the vascular anatomy.
CONTRAST:  75mL OMNIPAQUE IOHEXOL 350 MG/ML SOLN

[Series 5: thins · axial · 0.70mm/px · z∈[-238,-6]mm · 18 of 258 slices shown]
[im 13/258  lung]
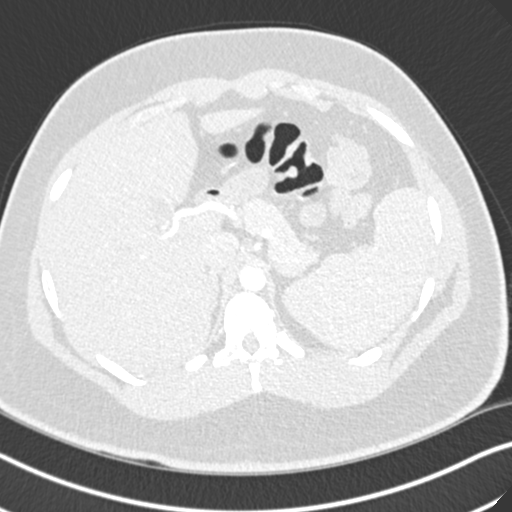
[im 26/258  mediastinal]
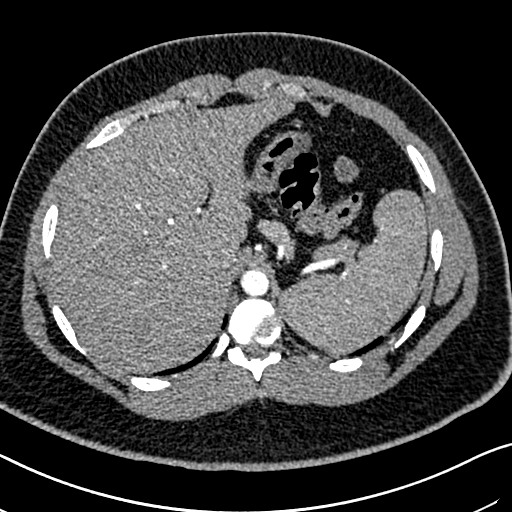
[im 39/258  lung]
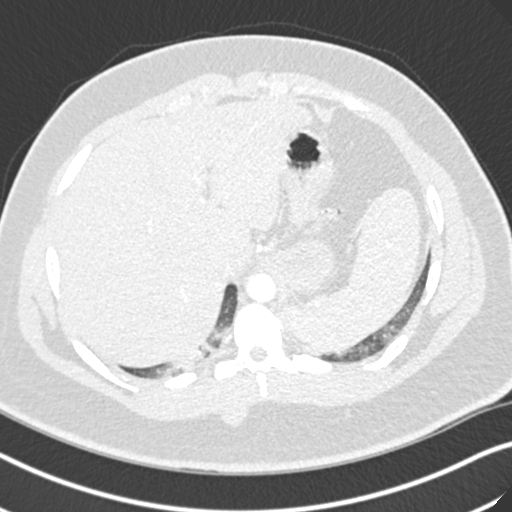
[im 52/258  mediastinal]
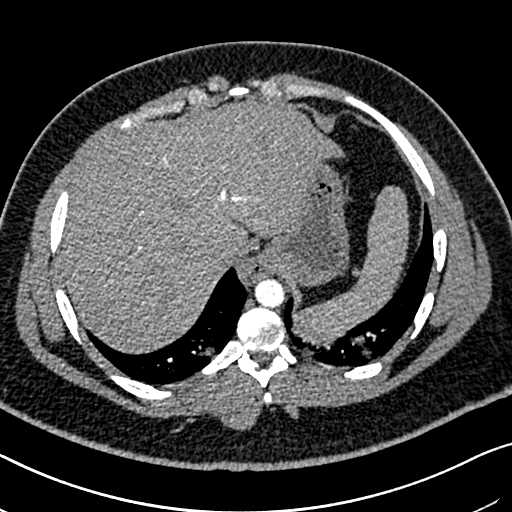
[im 65/258  lung]
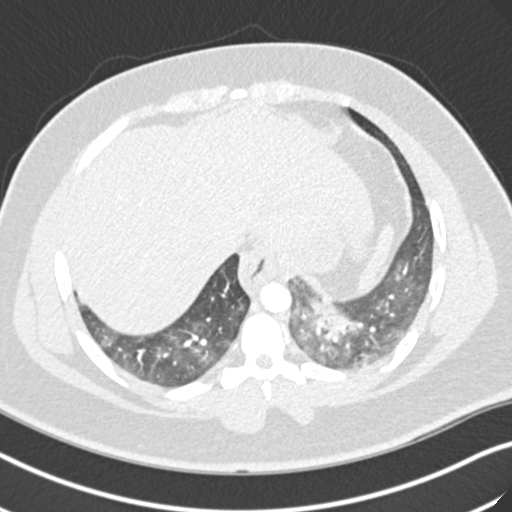
[im 78/258  mediastinal]
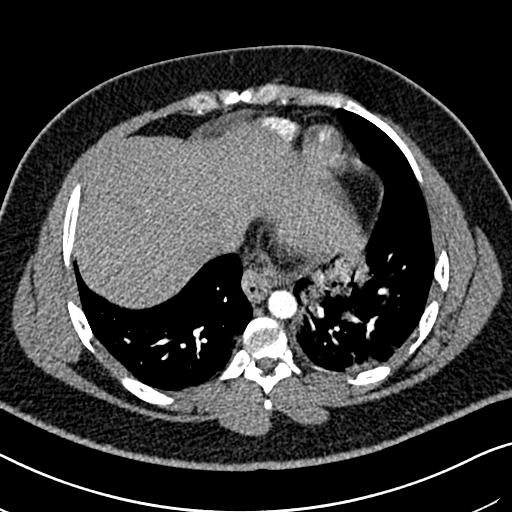
[im 90/258  lung]
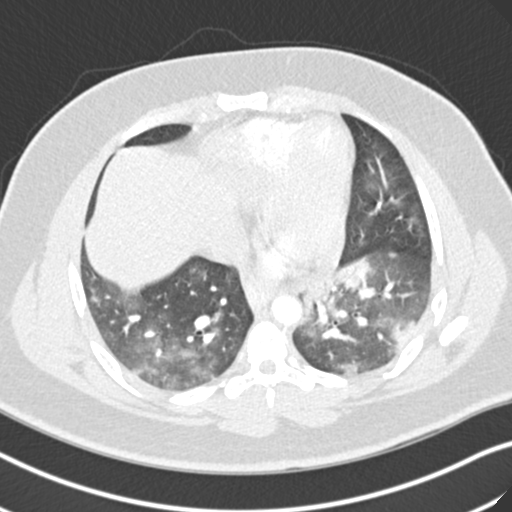
[im 103/258  mediastinal]
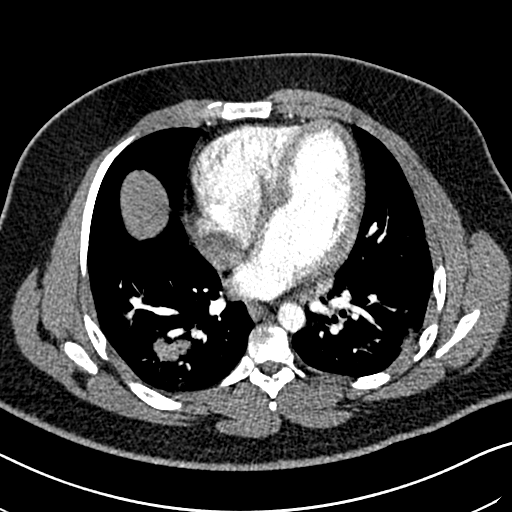
[im 116/258  lung]
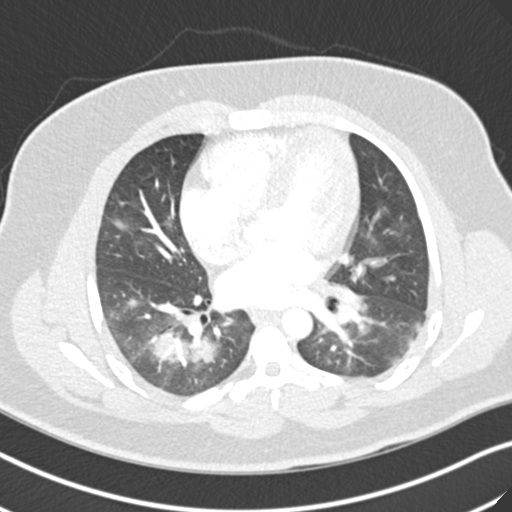
[im 142/258  mediastinal]
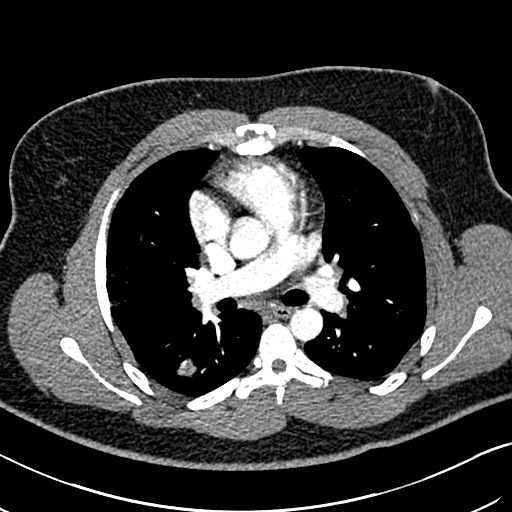
[im 155/258  lung]
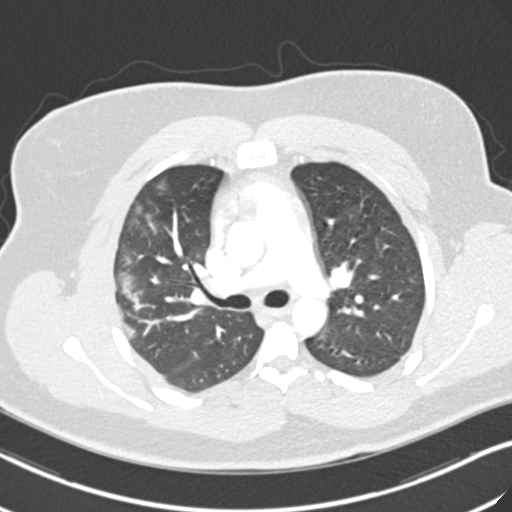
[im 168/258  mediastinal]
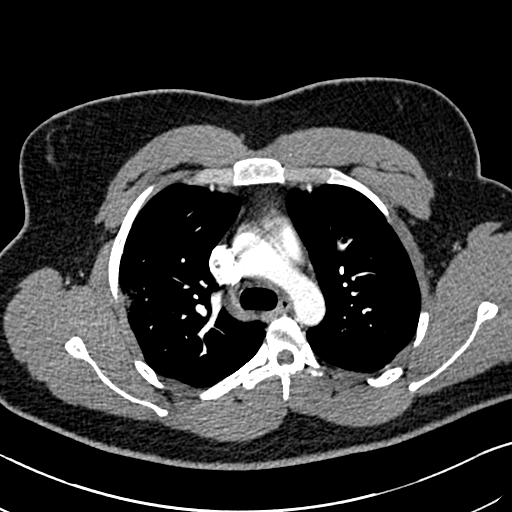
[im 180/258  lung]
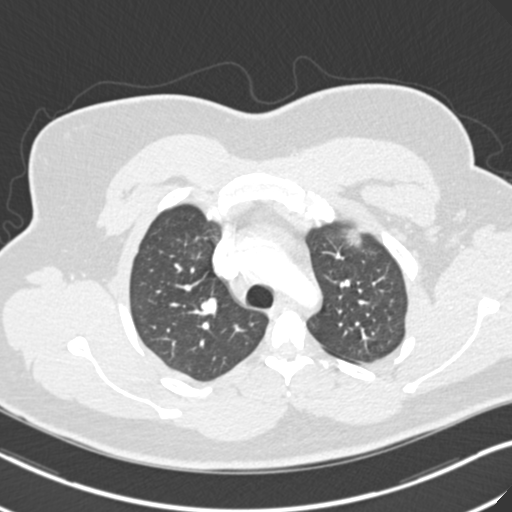
[im 193/258  mediastinal]
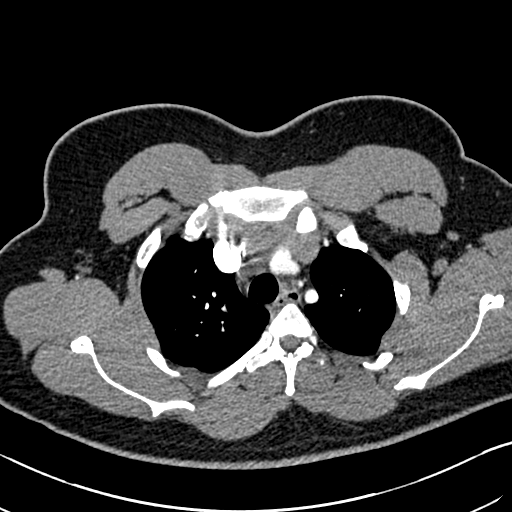
[im 206/258  lung]
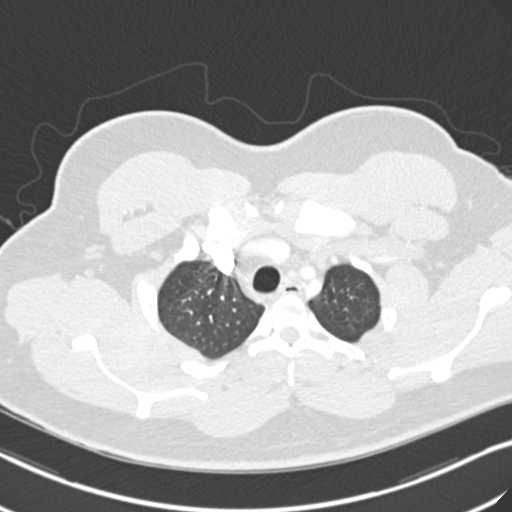
[im 219/258  mediastinal]
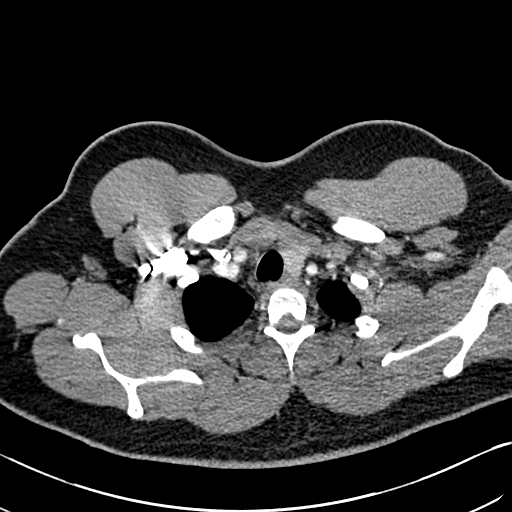
[im 232/258  lung]
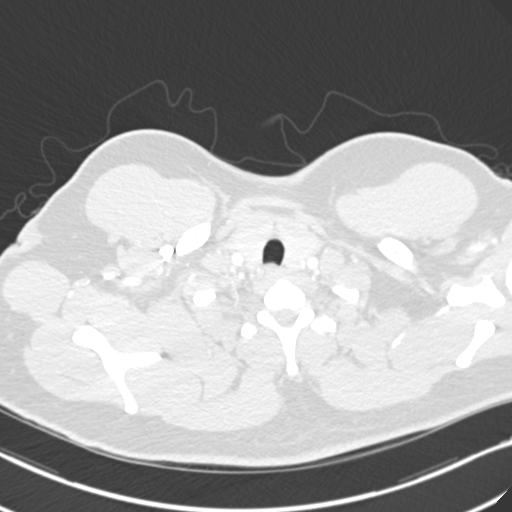
[im 245/258  mediastinal]
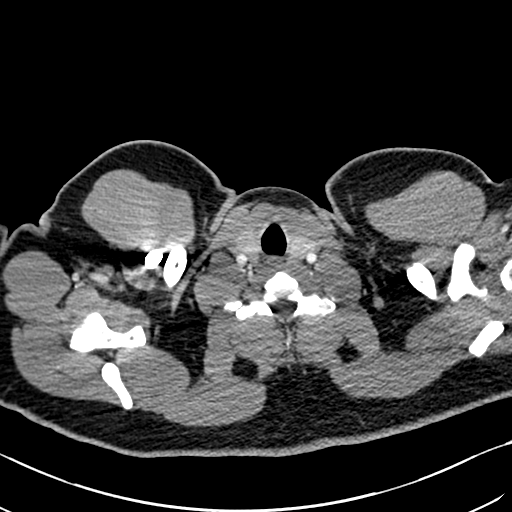

[Series 7: coronal mpr · coronal · 0.50mm/px · 1 of 93 slices shown]
[im 47/93  mediastinal]
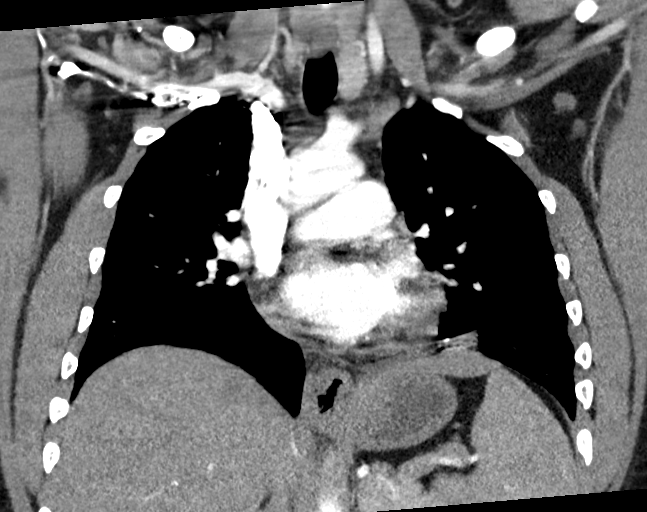

[19 of 36 positions shown; findings below may reference images not displayed]

FINDINGS: Cardiovascular: Satisfactory opacification of the pulmonary arteries
to the segmental level. No evidence of pulmonary embolism. Normal
heart size. No pericardial effusion.

Mediastinum/Nodes: No enlarged mediastinal, hilar, or axillary lymph
nodes. Thyroid gland, trachea, and esophagus demonstrate no
significant findings.

Lungs/Pleura: Patchy areas of airspace disease in the right upper
lobe, left upper lobe, right middle lobe and bilateral lower lobes
most concerning for multilobar pneumonia. No pleural effusion or
pneumothorax.

Upper Abdomen: No acute abnormality. Small hiatal hernia.

Musculoskeletal: No acute osseous abnormality. No aggressive osseous
lesion.

Review of the MIP images confirms the above findings.
IMPRESSION: 1. No evidence pulmonary embolus.
2. Multilobar pneumonia.

## 2020-09-08 ENCOUNTER — Other Ambulatory Visit: Payer: Self-pay | Admitting: Physician Assistant

## 2020-09-08 ENCOUNTER — Other Ambulatory Visit: Payer: Self-pay

## 2020-09-08 ENCOUNTER — Encounter: Payer: Self-pay | Admitting: Physician Assistant

## 2020-09-08 ENCOUNTER — Ambulatory Visit (INDEPENDENT_AMBULATORY_CARE_PROVIDER_SITE_OTHER): Payer: Federal, State, Local not specified - PPO | Admitting: Physician Assistant

## 2020-09-08 VITALS — BP 127/87 | HR 88 | Temp 98.8°F | Ht 69.0 in | Wt 254.3 lb

## 2020-09-08 DIAGNOSIS — E8881 Metabolic syndrome: Secondary | ICD-10-CM | POA: Diagnosis not present

## 2020-09-08 DIAGNOSIS — R7989 Other specified abnormal findings of blood chemistry: Secondary | ICD-10-CM

## 2020-09-08 DIAGNOSIS — Z0189 Encounter for other specified special examinations: Secondary | ICD-10-CM

## 2020-09-08 DIAGNOSIS — Z114 Encounter for screening for human immunodeficiency virus [HIV]: Secondary | ICD-10-CM | POA: Diagnosis not present

## 2020-09-08 DIAGNOSIS — F200 Paranoid schizophrenia: Secondary | ICD-10-CM

## 2020-09-08 DIAGNOSIS — G43809 Other migraine, not intractable, without status migrainosus: Secondary | ICD-10-CM | POA: Diagnosis not present

## 2020-09-08 DIAGNOSIS — E6609 Other obesity due to excess calories: Secondary | ICD-10-CM

## 2020-09-08 DIAGNOSIS — R21 Rash and other nonspecific skin eruption: Secondary | ICD-10-CM

## 2020-09-08 DIAGNOSIS — Z1159 Encounter for screening for other viral diseases: Secondary | ICD-10-CM | POA: Diagnosis not present

## 2020-09-08 DIAGNOSIS — Z0000 Encounter for general adult medical examination without abnormal findings: Secondary | ICD-10-CM | POA: Diagnosis not present

## 2020-09-08 DIAGNOSIS — Z79899 Other long term (current) drug therapy: Secondary | ICD-10-CM

## 2020-09-08 DIAGNOSIS — Z6837 Body mass index (BMI) 37.0-37.9, adult: Secondary | ICD-10-CM

## 2020-09-08 MED ORDER — METFORMIN HCL ER (OSM) 500 MG PO TB24: 500.0000 mg | ORAL_TABLET | Freq: Every day | ORAL | 0 refills | Status: DC

## 2020-09-08 MED ORDER — PROPRANOLOL HCL 40 MG PO TABS: 40.0000 mg | ORAL_TABLET | Freq: Two times a day (BID) | ORAL | 0 refills | Status: DC

## 2020-09-08 NOTE — Telephone Encounter (Signed)
Requested medication (s) are due for refill today: Yes  Requested medication (s) are on the active medication list: Yes  Last refill:  Today  Future visit scheduled: Yes  Notes to clinic:  Notes from pharmacy: Pharmacy comment: Alternative Requested:THE [place]. PLEASE CONSIDER CHANGING TO ONE OF THE SUGGESTED COVERED ALTERNATIVES OR SUBMITTING FOR A PRIOR AUTHORIZATION.      Requested Prescriptions  Pending Prescriptions Disp Refills   metFORMIN (GLUCOPHAGE-XR) 500 MG 24 hr tablet [Pharmacy Med Name: METFORMIN HCL ER 500 MG TABLET]  0      Endocrinology:  Diabetes - Biguanides Failed - 09/08/2020  3:11 PM      Failed - Cr in normal range and within 360 days    Creatinine  Date Value Ref Range Status  09/19/2013 1.05 0.60 - 1.30 mg/dL Final   Creatinine, Ser  Date Value Ref Range Status  01/04/2020 1.38 (H) 0.61 - 1.24 mg/dL Final          Failed - HBA1C is between 0 and 7.9 and within 180 days    Hgb A1c MFr Bld  Date Value Ref Range Status  02/21/2018 5.1 4.8 - 5.6 % Final    Comment:             Prediabetes: 5.7 - 6.4          Diabetes: >6.4          Glycemic control for adults with diabetes: <7.0           Passed - AA eGFR in normal range and within 360 days    EGFR (African American)  Date Value Ref Range Status  09/19/2013 >60  Final   GFR calc Af Amer  Date Value Ref Range Status  01/04/2020 >60 >60 mL/min Final   EGFR (Non-African Amer.)  Date Value Ref Range Status  09/19/2013 >60  Final    Comment:    eGFR values <57mL/min/1.73 m2 may be an indication of chronic kidney disease (CKD). Calculated eGFR is useful in patients with stable renal function. The eGFR calculation will not be reliable in acutely ill patients when serum creatinine is changing rapidly. It is not useful in  patients on dialysis. The eGFR calculation may not be applicable to patients at the low and high extremes of body sizes,  pregnant women, and vegetarians.    GFR calc non Af Amer  Date Value Ref Range Status  01/04/2020 >60 >60 mL/min Final          Passed - Valid encounter within last 6 months    Recent Outpatient Visits           Today Annual physical exam   [place], [place], [place]   8 months ago Acute left ankle pain   [facility] Carles Collet M, [place]   1 year ago Suspected Covid-19 Virus Infection   [facility] Gas, Dionne Bucy, MD   1 year ago Influenza-like illness   [place], [place], [place]   2 years ago Upper respiratory tract infection, unspecified type   [facility] Birdie Sons, MD       Future Appointments             In 2 months Trinna Post, [place], [place]

## 2020-09-08 NOTE — Progress Notes (Signed)
Complete physical exam   Patient: Michael Vega   DOB: September 16, 1996   24 y.o. Male  MRN: [id number] Visit Date: 09/08/2020  Today's healthcare provider: Trinna Post, PA-C   Chief Complaint  Patient presents with  . Annual Exam  I,Jorden Mahl M Vayla Wilhelmi,acting as a scribe for Trinna Post, PA-C.,have documented all relevant documentation on the behalf of Trinna Post, PA-C,as directed by  Trinna Post, PA-C while in the presence of Trinna Post, PA-C.  Subjective    Michael Vega is a 24 y.o. male who presents today for a complete physical exam.  He reports consuming a general diet. Home exercise routine includes treadmill. He generally feels well. He reports sleeping fairly well. He does not have additional problems to discuss today.  HPI   Currently studying at [employer] - integrated professional studies. Reports he will graduate in 2023. Wants to move to [place] or [place], [place].   Schizophrenia: Changed to Abilify in April 2021 by his psychiatrist. He has gained 25 pounds since then.   Wt Readings from Last 3 Encounters:  09/08/20 254 lb 4.8 oz (115.3 kg)  01/04/20 230 lb (104.3 kg)  11/21/19 230 lb (104.3 kg)   Headaches: Patient reports headaches since middle school and head sensitivity. Reports difficulty using hats and leaning against the wall will cause sensitivity. Reports history of migraine headaches and headaches that made him leave school. Reports he will have such severe pain during headaches he will feel as if he has to vomit. Sometimes he will experience visual disturbances. Reports he gets headaches nearly every other day. He takes excedrin and ibuprofen whenever he has a headache.   Past Medical History:  Diagnosis Date  . Asthma   . Depression   . Schizophrenia ([place])   . Schizophrenia, paranoid ([place])   . Seasonal allergies    Past Surgical History:  Procedure Laterality Date  . WISDOM TOOTH EXTRACTION     Social History    Socioeconomic History  . Marital status: Single    Spouse name: Not on file  . Number of children: Not on file  . Years of education: Not on file  . Highest education level: Not on file  Occupational History  . Not on file  Tobacco Use  . Smoking status: Never Smoker  . Smokeless tobacco: Never Used  Vaping Use  . Vaping Use: Never used  Substance and Sexual Activity  . Alcohol use: No    Alcohol/week: 0.0 standard drinks  . Drug use: No  . Sexual activity: Not on file  Other Topics Concern  . Not on file  Social History Narrative  . Not on file   Social Determinants of Health   Financial Resource Strain: Not on file  Food Insecurity: Not on file  Transportation Needs: Not on file  Physical Activity: Not on file  Stress: Not on file  Social Connections: Not on file  Intimate Partner Violence: Not on file   Family Status  Relation Name Status  . Mother  Alive  . Father  Alive  . Sister  Alive  . Sister  Alive       half sister  . Sister  Alive       half sister  . PGF  (Not Specified)  . Neg Hx  (Not Specified)   Family History  Problem Relation Age of Onset  . Healthy Mother   . Healthy Father   . Cirrhosis Paternal  Grandfather   . ADD / ADHD Neg Hx   . Alcohol abuse Neg Hx   . Anxiety disorder Neg Hx   . Bipolar disorder Neg Hx   . Depression Neg Hx   . Schizophrenia Neg Hx    No Known Allergies  Patient Care Team: Maryella Shivers as PCP - General (Physician Assistant)   Medications: Outpatient Medications Prior to Visit  Medication Sig  . ARIPiprazole (ABILIFY) 2 MG tablet Take 2 tablets (4 mg total) by mouth daily.  . ARIPiprazole (ABILIFY) 2 MG tablet Take 2 tablets (4 mg total) by mouth daily.  . budesonide-formoterol (SYMBICORT) 160-4.5 MCG/ACT inhaler Inhale 2 puffs into the lungs in the morning and at bedtime.  Marland Kitchen EPINEPHrine 0.3 mg/0.3 mL IJ SOAJ injection Inject 0.3 mg into the muscle as needed.  . loratadine (CLARITIN) 10 MG  tablet Take 10 mg by mouth daily as needed for allergies.  . meloxicam (MOBIC) 7.5 MG tablet TAKE 1 TABLET BY MOUTH EVERY DAY  . budesonide (PULMICORT) 0.5 MG/2ML nebulizer solution Inhale into the lungs 2 (two) times daily.   No facility-administered medications prior to visit.    Review of Systems  Constitutional: Negative.   HENT: Negative.   Eyes: Negative.   Respiratory: Negative.   Cardiovascular: Negative.   Gastrointestinal: Negative.   Endocrine: Negative.   Genitourinary: Negative.   Musculoskeletal: Negative.   Skin: Negative.   Allergic/Immunologic: Negative.   Neurological: Negative.   Hematological: Negative.   Psychiatric/Behavioral: Negative.       Objective    BP 127/87 (BP Location: Left Arm, Patient Position: Sitting, Cuff Size: Large)   Pulse 88   Temp 98.8 F (37.1 C) (Oral)   Ht 5\' 9"  (1.753 m)   Wt 254 lb 4.8 oz (115.3 kg)   SpO2 99%   BMI 37.55 kg/m    Physical Exam Constitutional:      Appearance: Normal appearance. He is obese.  HENT:     Right Ear: Tympanic membrane, ear canal and external ear normal.     Left Ear: Tympanic membrane, ear canal and external ear normal.  Neck:   Cardiovascular:     Rate and Rhythm: Normal rate and regular rhythm.     Pulses: Normal pulses.     Heart sounds: Normal heart sounds.  Pulmonary:     Effort: Pulmonary effort is normal.     Breath sounds: Normal breath sounds.  Abdominal:     General: Abdomen is flat. Bowel sounds are normal.     Palpations: Abdomen is soft.  Skin:    General: Skin is warm and dry.       Neurological:     General: No focal deficit present.     Mental Status: He is alert and oriented to person, place, and time.  Psychiatric:        Mood and Affect: Mood normal.        Behavior: Behavior normal.       Last depression screening scores PHQ 2/9 Scores 09/08/2020  PHQ - 2 Score 1  PHQ- 9 Score 3   Last fall risk screening Fall Risk  09/08/2020  Falls in the past  year? 1  Number falls in past yr: 0  Injury with Fall? 0  Risk for fall due to : History of fall(s)  Follow up Falls evaluation completed;Education provided;Falls prevention discussed   Last Audit-C alcohol use screening Alcohol Use Disorder Test (AUDIT) 09/08/2020  1. How often do you have  a drink containing alcohol? 1  2. How many drinks containing alcohol do you have on a typical day when you are drinking? 0  3. How often do you have six or more drinks on one occasion? 0  AUDIT-C Score 1   A score of 3 or more in women, and 4 or more in men indicates increased risk for alcohol abuse, EXCEPT if all of the points are from question 1   No results found for any visits on 09/08/20.  Assessment & Plan    Routine Health Maintenance and Physical Exam  Exercise Activities and Dietary recommendations Goals    . Increase physical activity    . Reduce portion size       Immunization History  Administered Date(s) Administered  . DTaP 08/31/1995, 10/30/1995, 11/12/1996, 03/29/2000  . HPV 9-valent 09/11/2017  . Hepatitis B 06[birth year]03[birth year]95, 08/31/1995, 12/26/1995  . HiB (PRP-T) 08/31/1995, 10/30/1995, 12/26/1995, 11/12/1996  . IPV 08/31/1995, 10/30/1995, 12/26/1995, 11/12/1996, 11/02/1997  . Influenza Inj Mdck Quad Pf 07/16/2019  . MMR 11/12/1996, 03/29/2000  . Meningococcal B, OMV 09/11/2017  . Meningococcal Conjugate 09/11/2017  . Td 09/11/2017  . Tdap 02/19/2007  . Varicella 06/26/1996, 02/19/2007    Health Maintenance  Topic Date Due  . Hepatitis C Screening  Never done  . COVID-19 Vaccine (1) Never done  . INFLUENZA VACCINE  04/25/2020  . TETANUS/TDAP  09/12/2027  . HIV Screening  Completed    Discussed health benefits of physical activity, and encouraged him to engage in regular exercise appropriate for his age and condition.  1. Annual physical exam  - TSH - Lipid panel - Comprehensive metabolic panel - CBC with Differential/Platelet - Hepatitis C antibody  2.  Encounter for screening for HIV   3. Encounter for hepatitis C screening test for low risk patient   4. Paranoid schizophrenia ([place])  Followed by [facility] in [place], [place].  5. Elevated serum creatinine  Recheck CMET today.  6. Assessment of effects of psychotropic drug in patient at risk for metabolic syndrome  Trial of metformin for antipsychotic related metabolic syndrome.   - metformin (FORTAMET) 500 MG (OSM) 24 hr tablet; Take 1 tablet (500 mg total) by mouth daily with breakfast.  Dispense: 90 tablet; Refill: 0  7. Metabolic syndrome  - metformin (FORTAMET) 500 MG (OSM) 24 hr tablet; Take 1 tablet (500 mg total) by mouth daily with breakfast.  Dispense: 90 tablet; Refill: 0  8. Other migraine without status migrainosus, not intractable  Start prophylaxis as below.  - propranolol (INDERAL) 40 MG tablet; Take 1 tablet (40 mg total) by mouth 2 (two) times daily.  Dispense: 180 tablet; Refill: 0  9. Rash  He does have acanthosis nigricans on his neck. His back I am less sure about and will have him see a dermatologist.  - Ambulatory referral to Dermatology  10. Class 2 obesity due to excess calories without serious comorbidity with body mass index (BMI) of 37.0 to 37.9 in adult  - Amb ref to Medical Nutrition Therapy-MNT   Return in about 2 months (around 11/09/2020).     ITrinna Post, PA-C, have reviewed all documentation for this visit. The documentation on 09/08/20 for the exam, diagnosis, procedures, and orders are all accurate and complete.  The entirety of the information documented in the History of Present Illness, Review of Systems and Physical Exam were personally obtained by me. Portions of this information were initially documented by [facility] and reviewed by me for  thoroughness and accuracy.     Paulene Floor  [facility] [phone] (phone) [phone] (fax)  [place]

## 2020-09-09 ENCOUNTER — Other Ambulatory Visit: Payer: Self-pay | Admitting: Physician Assistant

## 2020-09-09 DIAGNOSIS — Z0189 Encounter for other specified special examinations: Secondary | ICD-10-CM

## 2020-09-09 DIAGNOSIS — E8881 Metabolic syndrome: Secondary | ICD-10-CM

## 2020-09-09 LAB — CBC WITH DIFFERENTIAL/PLATELET
Basophils Absolute: 0.1 10*3/uL (ref 0.0–0.2)
Basos: 1 %
EOS (ABSOLUTE): 0.1 10*3/uL (ref 0.0–0.4)
Eos: 1 %
Hematocrit: 49.9 % (ref 37.5–51.0)
Hemoglobin: 17 g/dL (ref 13.0–17.7)
Immature Grans (Abs): 0 10*3/uL (ref 0.0–0.1)
Immature Granulocytes: 0 %
Lymphocytes Absolute: 1.3 10*3/uL (ref 0.7–3.1)
Lymphs: 27 %
MCH: 28.8 pg (ref 26.6–33.0)
MCHC: 34.1 g/dL (ref 31.5–35.7)
MCV: 85 fL (ref 79–97)
Monocytes Absolute: 0.3 10*3/uL (ref 0.1–0.9)
Monocytes: 7 %
Neutrophils Absolute: 2.9 10*3/uL (ref 1.4–7.0)
Neutrophils: 64 %
Platelets: 284 10*3/uL (ref 150–450)
RBC: 5.9 x10E6/uL — ABNORMAL HIGH (ref 4.14–5.80)
RDW: 12.3 % (ref 11.6–15.4)
WBC: 4.6 10*3/uL (ref 3.4–10.8)

## 2020-09-09 LAB — COMPREHENSIVE METABOLIC PANEL
ALT: 17 IU/L (ref 0–44)
AST: 16 IU/L (ref 0–40)
Albumin/Globulin Ratio: 1.9 (ref 1.2–2.2)
Albumin: 4.7 g/dL (ref 4.1–5.2)
Alkaline Phosphatase: 80 IU/L (ref 44–121)
BUN/Creatinine Ratio: 12 (ref 9–20)
BUN: 14 mg/dL (ref 6–20)
Bilirubin Total: 0.4 mg/dL (ref 0.0–1.2)
CO2: 22 mmol/L (ref 20–29)
Calcium: 10.2 mg/dL (ref 8.7–10.2)
Chloride: 103 mmol/L (ref 96–106)
Creatinine, Ser: 1.14 mg/dL (ref 0.76–1.27)
GFR calc Af Amer: 103 mL/min/{1.73_m2} (ref >59)
GFR calc non Af Amer: 89 mL/min/{1.73_m2} (ref >59)
Globulin, Total: 2.5 g/dL (ref 1.5–4.5)
Glucose: 79 mg/dL (ref 65–99)
Potassium: 4.6 mmol/L (ref 3.5–5.2)
Sodium: 142 mmol/L (ref 134–144)
Total Protein: 7.2 g/dL (ref 6.0–8.5)

## 2020-09-09 LAB — LIPID PANEL
Chol/HDL Ratio: 5.6 ratio — ABNORMAL HIGH (ref 0.0–5.0)
Cholesterol, Total: 197 mg/dL (ref 100–199)
HDL: 35 mg/dL — ABNORMAL LOW (ref >39)
LDL Chol Calc (NIH): 131 mg/dL — ABNORMAL HIGH (ref 0–99)
Triglycerides: 174 mg/dL — ABNORMAL HIGH (ref 0–149)
VLDL Cholesterol Cal: 31 mg/dL (ref 5–40)

## 2020-09-09 LAB — TSH: TSH: 0.762 u[IU]/mL (ref 0.450–4.500)

## 2020-09-09 LAB — HEPATITIS C ANTIBODY: Hep C Virus Ab: <0.1 s/co ratio (ref 0.0–0.9)

## 2020-09-09 NOTE — Telephone Encounter (Signed)
Requested medication (s) are due for refill today: no  Requested medication (s) are on the active medication list: no  Last refill:  09/09/20 as metformin Fortamet OSM  Future visit scheduled: Yes  Notes to clinic: Pharmacy requesting alternative or pre authorization  This is not covered by insurance.    Requested Prescriptions  Pending Prescriptions Disp Refills   metFORMIN (GLUCOPHAGE-XR) 500 MG 24 hr tablet [Pharmacy Med Name: METFORMIN HCL ER 500 MG TABLET]  0      Endocrinology:  Diabetes - Biguanides Failed - 09/09/2020 12:13 PM      Failed - HBA1C is between 0 and 7.9 and within 180 days    Hgb A1c MFr Bld  Date Value Ref Range Status  02/21/2018 5.1 4.8 - 5.6 % Final    Comment:             Prediabetes: 5.7 - 6.4          Diabetes: >6.4          Glycemic control for adults with diabetes: <7.0           Passed - Cr in normal range and within 360 days    Creatinine  Date Value Ref Range Status  09/19/2013 1.05 0.60 - 1.30 mg/dL Final   Creatinine, Ser  Date Value Ref Range Status  09/08/2020 1.14 0.76 - 1.27 mg/dL Final          Passed - AA eGFR in normal range and within 360 days    EGFR (African American)  Date Value Ref Range Status  09/19/2013 >60  Final   GFR calc Af Amer  Date Value Ref Range Status  09/08/2020 103 >59 mL/min/1.73 Final    Comment:    **In accordance with recommendations from the NKF-ASN Task force,**   [facility] is in the process of updating its eGFR calculation to the   2021 CKD-EPI creatinine equation that estimates kidney function   without a race variable.    EGFR (Non-African Amer.)  Date Value Ref Range Status  09/19/2013 >60  Final    Comment:    eGFR values <7mL/min/1.73 m2 may be an indication of chronic kidney disease (CKD). Calculated eGFR is useful in patients with stable renal function. The eGFR calculation will not be reliable in acutely ill patients when serum creatinine is changing rapidly. It is not useful  in  patients on dialysis. The eGFR calculation may not be applicable to patients at the low and high extremes of body sizes, pregnant women, and vegetarians.    GFR calc non Af Amer  Date Value Ref Range Status  09/08/2020 89 >59 mL/min/1.73 Final          Passed - Valid encounter within last 6 months    Recent Outpatient Visits           Yesterday Annual physical exam   [facility] Carles Collet M, [place]   8 months ago Acute left ankle pain   [facility] Carles Collet M, [place]   1 year ago Suspected Covid-19 Virus Infection   [facility] [place], Dionne Bucy, MD   1 year ago Influenza-like illness   [place], [place], [place]   2 years ago Upper respiratory tract infection, unspecified type   [facility] Birdie Sons, MD       Future Appointments             In 2 months Brendolyn Patty, MD [facility]   In 2 months Terrilee Croak, Wendee Beavers, PA-C [employer]

## 2020-09-09 NOTE — Telephone Encounter (Signed)
L.O.V. was on 09/08/2020 and next appointment is on 11/09/2020. Medication refill was send into pharmacy on yesterday,09/08/2020.

## 2020-09-10 NOTE — Telephone Encounter (Signed)
L.O.V. was on 09/08/2020 and medication was send into pharmacy that day.

## 2020-09-23 ENCOUNTER — Telehealth (HOSPITAL_COMMUNITY): Payer: Self-pay | Admitting: *Deleted

## 2020-09-23 ENCOUNTER — Other Ambulatory Visit: Payer: Self-pay

## 2020-09-23 ENCOUNTER — Telehealth (HOSPITAL_COMMUNITY): Payer: Federal, State, Local not specified - PPO | Admitting: Psychiatry

## 2020-09-23 DIAGNOSIS — F251 Schizoaffective disorder, depressive type: Secondary | ICD-10-CM

## 2020-09-23 DIAGNOSIS — R29818 Other symptoms and signs involving the nervous system: Secondary | ICD-10-CM

## 2020-09-23 MED ORDER — ARIPIPRAZOLE 2 MG PO TABS: 4.0000 mg | ORAL_TABLET | Freq: Every day | ORAL | 1 refills | Status: DC

## 2020-09-23 NOTE — Telephone Encounter (Signed)
I appreciate it Marcelino Duster. The order may  need to be changed for the appropriate location.

## 2020-09-23 NOTE — Progress Notes (Signed)
Virtual Visit via Video Note  I connected with [name] on 09/23/20 at  9:00 AM EST by a phone application- we attempted to do a video enabled telemedicine application but were unable to connect due to issues on the patient's part- and verified that I am speaking with the correct person using two identifiers.  Location: Patient: home Provider: office   I discussed the limitations of evaluation and management by telemedicine and the availability of in person appointments. The patient expressed understanding and agreed to proceed.  History of Present Illness: Ladarren states he is doing well. His mood is stable and he denies anxiety. He denies SI/HI. Marcel is not sleeping well lately due to vivid, violent nightmares. It began about 1 week ago. At times he has VH of something in the corner of his eye but never finds anything. Khairi denies AH and paranoia. He is having daily headaches. It is concerning because he is having similar symptoms to his mom who had to have brain decompression surgery. Randomly he has difficulty finding words, slurred speech and tremors in his limbs. Hicks went to his family care doctor who she did not feel he needed further evaluation or referral to anyone. Most of this began about 2 weeks ago. The headaches did not become this intense until about a week ago. He is unable to put any pressure on his head- such as leaning back or wear hats anymore.  He is not taking Propranolol or Metformin because he did not feel comfortable. He has not exercising and really doesn't have the motivation to do so.    Observations/Objective:  General Appearance: unable to assess  Eye Contact:  unable to assess  Speech:  Clear and Coherent and Normal Rate  Volume:  Normal  Mood:  Anxious  Affect:  Congruent  Thought Process:  Coherent and Descriptions of Associations: Circumstantial  Orientation:  Full (Time, Place, and Person)  Thought Content:  Rumination  Suicidal Thoughts:   No  Homicidal Thoughts:  No  Memory:  Immediate;   Good  Judgement:  Good  Insight:  Good  Psychomotor Activity: unable to assess  Concentration:  Concentration: Good  Recall:  Good  Fund of Knowledge:  Good  Language:  Good  Akathisia:  unable to assess  Handed:  Right  AIMS (if indicated):     Assets:  Communication Skills Desire for Improvement Financial Resources/Insurance Housing Resilience Social Support Talents/Skills Transportation Vocational/Educational  ADL's:  unable to assess  Cognition:  WNL  Sleep:         Assessment and Plan: 1. Schizoaffective disorder, depressive type (HCC) - ARIPiprazole (ABILIFY) 2 MG tablet; Take 2 tablets (4 mg total) by mouth daily.  Dispense: 180 tablet; Refill: 1  2. Neurologic abnormality - ordered MR BRAIN FMRI NO PHYS/PSYCH SUPV; Future   Reviewed labs done 09/08/2020- Creatinine baseline, LDL and cholesterol elevated- discussed diet changes and exercising Recommended starting Metformin due to weight gain  Referred to Neurology due to migraines and other neurological symptoms.  Follow Up Instructions: In 2-3 weeks or sooner if needed   I discussed the assessment and treatment plan with the patient. The patient was provided an opportunity to ask questions and all were answered. The patient agreed with the plan and demonstrated an understanding of the instructions.   The patient was advised to call back or seek an in-person evaluation if the symptoms worsen or if the condition fails to improve as anticipated.  I provided 35 minutes of non-face-to-face  time during this encounter.   Oletta Darter, MD

## 2020-09-23 NOTE — Telephone Encounter (Signed)
Pt called stating that he called [facility] and was told that they do not do "that type of MRI". Writer will call [facility] to see if they can do. FYI.

## 2020-09-28 ENCOUNTER — Other Ambulatory Visit (HOSPITAL_COMMUNITY): Payer: Self-pay | Admitting: *Deleted

## 2020-09-28 DIAGNOSIS — F251 Schizoaffective disorder, depressive type: Secondary | ICD-10-CM

## 2020-09-30 DIAGNOSIS — F4322 Adjustment disorder with anxiety: Secondary | ICD-10-CM | POA: Diagnosis not present

## 2020-10-14 ENCOUNTER — Other Ambulatory Visit: Payer: Self-pay

## 2020-10-14 ENCOUNTER — Encounter (HOSPITAL_COMMUNITY): Payer: Self-pay | Admitting: Psychiatry

## 2020-10-14 ENCOUNTER — Telehealth (INDEPENDENT_AMBULATORY_CARE_PROVIDER_SITE_OTHER): Payer: Federal, State, Local not specified - PPO | Admitting: Psychiatry

## 2020-10-14 DIAGNOSIS — F200 Paranoid schizophrenia: Secondary | ICD-10-CM | POA: Diagnosis not present

## 2020-10-14 NOTE — Progress Notes (Signed)
Virtual Visit via Video Note  I connected with Michael Vega on 10/14/20 at 11:30 AM EST by a video enabled telemedicine application and verified that I am speaking with the correct person using two identifiers.    Location: Patient: home Provider: office   I discussed the limitations of evaluation and management by telemedicine and the availability of in person appointments. The patient expressed understanding and agreed to proceed.  History of Present Illness: Michael Vega states he is doing better. He feels calm and relaxed. He is in a better place mentally and physically. The headaches are not occurring as often. The migraines are happening about 1x/week. Slurred speech and word finding difficulty have dramatically improved and he now thinks it was due to stress. His hand tremor is also better.  He is no longer having violent dreams. Michael Vega is sleeping well. He denies depression. He denies SI/HI. Michael Vega's only stressor is school. He is working out at [employer] and is dieting. Michael Vega denies paranoia and ideas of reference. He denies AVH.  Michael Vega was not able to get the MRI. He has not yet set up a neurology appointment but plans to do so today.    Observations/Objective: Psychiatric Specialty Exam: ROS  There were no vitals taken for this visit.There is no height or weight on file to calculate BMI.  General Appearance: Casual and Neat  Eye Contact:  Good  Speech:  Clear and Coherent and Normal Rate  Volume:  Normal  Mood:  Euthymic  Affect:  Full Range  Thought Process:  Goal Directed, Linear and Descriptions of Associations: Intact  Orientation:  Full (Time, Place, and Person)  Thought Content:  Logical  Suicidal Thoughts:  No  Homicidal Thoughts:  No  Memory:  Immediate;   Good  Judgement:  Good  Insight:  Good  Psychomotor Activity:  Normal  Concentration:  Concentration: Good  Recall:  Good  Fund of Knowledge:  Good  Language:  Good  Akathisia:  No  Handed:  Right  AIMS  (if indicated):     Assets:  Communication Skills Desire for Improvement Financial Resources/Insurance Housing Leisure Time Physical Health Resilience Social Support Talents/Skills Transportation Vocational/Educational  ADL's:  Intact  Cognition:  WNL  Sleep:        Assessment and Plan:   1. Paranoid schizophrenia (HCC) - continue Abilify 4mg  po qD  2. MDD- recurrent, moderate Mood is stable. He denies any symptoms of depression.   Encouraged to set up appointment with neurology  Follow Up Instructions: In 3 months or sooner if needed   I discussed the assessment and treatment plan with the patient. The patient was provided an opportunity to ask questions and all were answered. The patient agreed with the plan and demonstrated an understanding of the instructions.   The patient was advised to call back or seek an in-person evaluation if the symptoms worsen or if the condition fails to improve as anticipated.  I provided 15 minutes of non-face-to-face time during this encounter.   , MD

## 2020-11-08 ENCOUNTER — Ambulatory Visit: Payer: Federal, State, Local not specified - PPO | Admitting: Dermatology

## 2020-11-09 ENCOUNTER — Ambulatory Visit: Payer: Federal, State, Local not specified - PPO | Admitting: Physician Assistant

## 2020-11-11 DIAGNOSIS — F4322 Adjustment disorder with anxiety: Secondary | ICD-10-CM | POA: Diagnosis not present

## 2020-11-25 DIAGNOSIS — F4322 Adjustment disorder with anxiety: Secondary | ICD-10-CM | POA: Diagnosis not present

## 2020-11-26 DIAGNOSIS — J4550 Severe persistent asthma, uncomplicated: Secondary | ICD-10-CM | POA: Diagnosis not present

## 2020-11-26 DIAGNOSIS — D824 Hyperimmunoglobulin E [IgE] syndrome: Secondary | ICD-10-CM | POA: Diagnosis not present

## 2020-12-01 ENCOUNTER — Other Ambulatory Visit: Payer: Self-pay | Admitting: Physician Assistant

## 2020-12-01 DIAGNOSIS — E8881 Metabolic syndrome: Secondary | ICD-10-CM

## 2020-12-01 DIAGNOSIS — Z79899 Other long term (current) drug therapy: Secondary | ICD-10-CM

## 2020-12-01 DIAGNOSIS — Z0189 Encounter for other specified special examinations: Secondary | ICD-10-CM

## 2020-12-09 ENCOUNTER — Telehealth (HOSPITAL_COMMUNITY): Payer: Federal, State, Local not specified - PPO | Admitting: Psychiatry

## 2020-12-22 DIAGNOSIS — F4322 Adjustment disorder with anxiety: Secondary | ICD-10-CM | POA: Diagnosis not present

## 2020-12-30 ENCOUNTER — Telehealth (INDEPENDENT_AMBULATORY_CARE_PROVIDER_SITE_OTHER): Payer: Federal, State, Local not specified - PPO | Admitting: Psychiatry

## 2020-12-30 ENCOUNTER — Other Ambulatory Visit: Payer: Self-pay

## 2020-12-30 DIAGNOSIS — F251 Schizoaffective disorder, depressive type: Secondary | ICD-10-CM | POA: Diagnosis not present

## 2020-12-30 MED ORDER — ARIPIPRAZOLE 2 MG PO TABS
4.0000 mg | ORAL_TABLET | Freq: Every day | ORAL | 1 refills | Status: DC
Start: 2020-12-30 — End: 2021-05-26

## 2020-12-30 NOTE — Progress Notes (Signed)
Virtual Visit via Telephone Note  I connected with [name] on 12/30/20 at 11:00 AM EDT by telephone and verified that I am speaking with the correct person using two identifiers.  Location: Patient: home Provider: office   I discussed the limitations, risks, security and privacy concerns of performing an evaluation and management service by telephone and the availability of in person appointments. I also discussed with the patient that there may be a patient responsible charge related to this service. The patient expressed understanding and agreed to proceed.   History of Present Illness: "I feel good emotionally and mentally". He has active and social. Rigdon denies depression. He has productive days. He denies SI/HI. Ramesses denies anxiety. [place] denies ideas of reference, paranoia and hallucinations. His health is good. He has made an appointment with neurologist to evaluate his headaches. Lately the headaches have not been as frequent.    Observations/Objective:  General Appearance: unable to assess  Eye Contact:  unable to assess  Speech:  Clear and Coherent and Normal Rate  Volume:  Normal  Mood:  Euthymic  Affect:  Full Range  Thought Process:  Goal Directed, Linear and Descriptions of Associations: Intact  Orientation:  Full (Time, Place, and Person)  Thought Content:  Logical  Suicidal Thoughts:  No  Homicidal Thoughts:  No  Memory:  Immediate;   Good  Judgement:  Good  Insight:  Good  Psychomotor Activity: unable to assess  Concentration:  Concentration: Good  Recall:  Good  Fund of Knowledge:  Good  Language:  Good  Akathisia:  unable to assess  Handed:  Right  AIMS (if indicated):     Assets:  Communication Skills Desire for Improvement Financial Resources/Insurance Housing Leisure Time Physical Health Resilience Social Support Talents/Skills Transportation Vocational/Educational  ADL's:  unable to assess  Cognition:  WNL  Sleep:          Assessment and Plan: Depression screen [facility] 2/9 12/30/2020 09/08/2020  Decreased Interest 0 0  Down, Depressed, Hopeless 0 1  PHQ - 2 Score 0 1  Altered sleeping - 0  Tired, decreased energy - 1  Change in appetite - 1  Feeling bad or failure about yourself  - 0  Trouble concentrating - 0  Moving slowly or fidgety/restless - 0  Suicidal thoughts - 0  PHQ-9 Score - 3  Difficult doing work/chores - Not difficult at all    Flowsheet Row Video Visit from 12/30/2020 in [facility]  C-SSRS RISK CATEGORY No Risk       Follow Up Instructions: In 5-6 months or sooner if needed   I discussed the assessment and treatment plan with the patient. The patient was provided an opportunity to ask questions and all were answered. The patient agreed with the plan and demonstrated an understanding of the instructions.   The patient was advised to call back or seek an in-person evaluation if the symptoms worsen or if the condition fails to improve as anticipated.  I provided 6 minutes of non-face-to-face time during this encounter.   Oletta Darter, MD

## 2021-01-02 ENCOUNTER — Other Ambulatory Visit: Payer: Self-pay | Admitting: Physician Assistant

## 2021-01-02 DIAGNOSIS — G43809 Other migraine, not intractable, without status migrainosus: Secondary | ICD-10-CM

## 2021-01-03 NOTE — Telephone Encounter (Signed)
Requested medication (s) are due for refill today: expired medication  Requested medication (s) are on the active medication list: yes  Last refill:  09/08/20- 12/07/20 #180 0 refill  Future visit scheduled: no  Notes to clinic:  expired medication , do you want to renew Rx ?     Requested Prescriptions  Pending Prescriptions Disp Refills   propranolol (INDERAL) 40 MG tablet [Pharmacy Med Name: PROPRANOLOL 40 MG TABLET] 180 tablet 0    Sig: TAKE 1 TABLET BY MOUTH TWICE A DAY      Cardiovascular:  Beta Blockers Passed - 01/02/2021 12:02 PM      Passed - Last BP in normal range    BP Readings from Last 1 Encounters:  09/08/20 127/87          Passed - Last Heart Rate in normal range    Pulse Readings from Last 1 Encounters:  09/08/20 88          Passed - Valid encounter within last 6 months    Recent Outpatient Visits           3 months ago Annual physical exam   [facility] Osvaldo Angst M, [place]   12 months ago Acute left ankle pain   [facility] [place], Lavella Hammock, [place]   1 year ago Suspected Covid-19 Virus Infection   [facility] [place], Marzella Schlein, [place]   2 years ago Influenza-like illness   [facility] Osvaldo Angst M, [place]   3 years ago Upper respiratory tract infection, unspecified type   [facility] Malva Limes, [place]

## 2021-01-05 DIAGNOSIS — F4322 Adjustment disorder with anxiety: Secondary | ICD-10-CM | POA: Diagnosis not present

## 2021-01-06 ENCOUNTER — Telehealth (HOSPITAL_COMMUNITY): Payer: Federal, State, Local not specified - PPO | Admitting: Psychiatry

## 2021-02-01 ENCOUNTER — Ambulatory Visit: Payer: Federal, State, Local not specified - PPO | Admitting: Neurology

## 2021-02-15 ENCOUNTER — Telehealth (HOSPITAL_COMMUNITY): Payer: Self-pay

## 2021-02-15 NOTE — Telephone Encounter (Signed)
Patient called and stated that he needs to talk with you about a few things related to school. He stated that it's not an emergency but that he needs documentation for school. I tried calling him to clarify what type of documentation he needs but he didn't pick up the phone. He would like you to call him.

## 2021-03-01 DIAGNOSIS — D824 Hyperimmunoglobulin E [IgE] syndrome: Secondary | ICD-10-CM | POA: Diagnosis not present

## 2021-05-19 DIAGNOSIS — F4322 Adjustment disorder with anxiety: Secondary | ICD-10-CM | POA: Diagnosis not present

## 2021-05-26 ENCOUNTER — Telehealth (INDEPENDENT_AMBULATORY_CARE_PROVIDER_SITE_OTHER): Payer: Federal, State, Local not specified - PPO | Admitting: Psychiatry

## 2021-05-26 ENCOUNTER — Encounter (HOSPITAL_COMMUNITY): Payer: Self-pay | Admitting: Psychiatry

## 2021-05-26 ENCOUNTER — Other Ambulatory Visit: Payer: Self-pay

## 2021-05-26 DIAGNOSIS — F251 Schizoaffective disorder, depressive type: Secondary | ICD-10-CM

## 2021-05-26 MED ORDER — ARIPIPRAZOLE 2 MG PO TABS: 2.0000 mg | ORAL_TABLET | Freq: Every day | ORAL | 0 refills | Status: DC

## 2021-05-26 NOTE — Progress Notes (Signed)
Virtual Visit via Video Note  I connected with Michael Vega on 05/26/21 at 11:00 AM EDT by a video enabled telemedicine application and verified that I am speaking with the correct person using two identifiers.  Location: Patient: home Provider: office   I discussed the limitations of evaluation and management by telemedicine and the availability of in person appointments. The patient expressed understanding and agreed to proceed.  History of Present Illness: "I'm good". Michael Vega has been having some issues with the medication. He notes that he is not able to sustain an erection or ejaculate. Michael Vega also thinks he might be seeing things randomly out the corner of his eye. This happens 1-2x/weeks and it usually at night. He denies AH, paranoia and ideas of reference. His sleep is good and he gets 8+ hrs/hight. He denies depression. He denies SI/HI.  Michael Vega denies any new health problems. He has an upcoming appointment with his PCP for a physical. 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    Observations/Objective: Psychiatric Specialty Exam: ROS  There were no vitals taken for this visit.There is no height or weight on file to calculate BMI.  General Appearance: Casual and Neat  Eye Contact:  Good  Speech:  Clear and Coherent and Normal Rate  Volume:  Normal  Mood:  Euthymic  Affect:  Full Range  Thought Process:  Goal Directed, Linear, and Descriptions of Associations: Intact  Orientation:  Full (Time, Place, and Person)  Thought Content:  Logical  Suicidal Thoughts:  No  Homicidal Thoughts:  No  Memory:  Immediate;   Good  Judgement:  Good  Insight:  Good  Psychomotor Activity:  Normal  Concentration:  Concentration: Good  Recall:  Good  Fund of Knowledge:  Good  Language:  Good  Akathisia:  No  Handed:  Right  AIMS (if indicated):     Assets:  Communication Skills Desire for Improvement Financial Resources/Insurance Housing Intimacy Leisure  Time Resilience Social Support Talents/Skills Transportation Vocational/Educational  ADL's:  Intact  Cognition:  WNL  Sleep:        Assessment and Plan:  - decreasing Abilify to 2mg  po qD due to sexual SE.   1. Schizoaffective disorder, depressive type (HCC) - ARIPiprazole (ABILIFY) 2 MG tablet; Take 1 tablet (2 mg total) by mouth daily.  Dispense: 90 tablet; Refill: 0  Follow Up Instructions: In 1-2 months or sooner if needed   I discussed the assessment and treatment plan with the patient. The patient was provided an opportunity to ask questions and all were answered. The patient agreed with the plan and demonstrated an understanding of the instructions.   The patient was advised to call back or seek an in-person evaluation if the symptoms worsen or if the condition fails to improve as anticipated.  I provided 17 minutes of non-face-to-face time during this encounter.   , MD

## 2021-06-21 DIAGNOSIS — F4322 Adjustment disorder with anxiety: Secondary | ICD-10-CM | POA: Diagnosis not present

## 2021-06-23 ENCOUNTER — Other Ambulatory Visit: Payer: Self-pay

## 2021-06-23 ENCOUNTER — Telehealth (HOSPITAL_BASED_OUTPATIENT_CLINIC_OR_DEPARTMENT_OTHER): Payer: Federal, State, Local not specified - PPO | Admitting: Psychiatry

## 2021-06-23 DIAGNOSIS — N538 Other male sexual dysfunction: Secondary | ICD-10-CM | POA: Diagnosis not present

## 2021-06-23 DIAGNOSIS — F251 Schizoaffective disorder, depressive type: Secondary | ICD-10-CM | POA: Diagnosis not present

## 2021-06-23 MED ORDER — TADALAFIL 5 MG PO TABS: 5.0000 mg | ORAL_TABLET | Freq: Every day | ORAL | 1 refills | Status: DC | PRN

## 2021-06-23 MED ORDER — ARIPIPRAZOLE 2 MG PO TABS: 4.0000 mg | ORAL_TABLET | Freq: Every day | ORAL | 0 refills | Status: DC

## 2021-06-23 NOTE — Progress Notes (Signed)
Virtual Visit via Telephone Note  I connected with [name] on 06/23/21 at  1:15 PM EDT by telephone and verified that I am speaking with the correct person using two identifiers.  Location: Patient: home Provider: office   I discussed the limitations, risks, security and privacy concerns of performing an evaluation and management service by telephone and the availability of in person appointments. I also discussed with the patient that there may be a patient responsible charge related to this service. The patient expressed understanding and agreed to proceed.   History of Present Illness: Amalio shares that that month of September was stressful. School is a little overwhelming, as his classes are hard. He is getting a better handle of his stress slowly over the last few days. Carlen has been anxious and experiencing low mood. He denies depression. He denies hopelessness and anhedonia. His sleep and appetite are good. He denies SI/HI. Arihaan denies paranoia, ideas of reference and AH. At night he rarely see's something out of the corner of his eye. It happened a few times and it was not overly concerning. Delvonte went to this therapist yesterday and this therapist expressed his concern that Heinrich was more stressed and his mood was down. At the lower dose of Abilify he continued to experience sexual side effects. He would like to increase Abilify to 4mg  again and would like to Cialis for sexual side effects. His morning he started taking 4mg  of Abilify again.    Observations/Objective:  General Appearance: unable to assess  Eye Contact:  unable to assess  Speech:  Clear and Coherent and Normal Rate  Volume:  Normal  Mood:  Depressed  Affect:  Congruent  Thought Process:  Goal Directed, Linear, and Descriptions of Associations: Intact  Orientation:  Full (Time, Place, and Person)  Thought Content:  Logical  Suicidal Thoughts:  No  Homicidal Thoughts:  No  Memory:  Immediate;    Good  Judgement:  Good  Insight:  Good  Psychomotor Activity: unable to assess  Concentration:  Concentration: Good  Recall:  Good  Fund of Knowledge:  Good  Language:  Good  Akathisia:  unable to assess  Handed:  unable to assess  AIMS (if indicated):     Assets:  Communication Skills Desire for Improvement Financial Resources/Insurance Housing Resilience Social Support Talents/Skills Transportation Vocational/Educational  ADL's:  unable to assess  Cognition:  WNL  Sleep:        Assessment and Plan: - increase Abilify 4mg  po qD -start Cialis for sexual side effects  1. Schizoaffective disorder, depressive type (HCC) - ARIPiprazole (ABILIFY) 2 MG tablet; Take 2 tablets (4 mg total) by mouth daily.  Dispense: 180 tablet; Refill: 0  2. Other male sexual dysfunction - tadalafil (CIALIS) 5 MG tablet; Take 1 tablet (5 mg total) by mouth daily as needed for erectile dysfunction.  Dispense: 10 tablet; Refill: 1     Follow Up Instructions: In 1-2 months or sooner if needed   I discussed the assessment and treatment plan with the patient. The patient was provided an opportunity to ask questions and all were answered. The patient agreed with the plan and demonstrated an understanding of the instructions.   The patient was advised to call back or seek an in-person evaluation if the symptoms worsen or if the condition fails to improve as anticipated.  I provided 17 minutes of non-face-to-face time during this encounter.   , MD

## 2021-07-11 DIAGNOSIS — F17218 Nicotine dependence, cigarettes, with other nicotine-induced disorders: Secondary | ICD-10-CM | POA: Diagnosis not present

## 2021-07-11 DIAGNOSIS — J4540 Moderate persistent asthma, uncomplicated: Secondary | ICD-10-CM | POA: Diagnosis not present

## 2021-07-14 ENCOUNTER — Encounter: Payer: Federal, State, Local not specified - PPO | Admitting: Family Medicine

## 2021-07-14 ENCOUNTER — Other Ambulatory Visit: Payer: Self-pay

## 2021-07-14 ENCOUNTER — Encounter: Payer: Federal, State, Local not specified - PPO | Admitting: Physician Assistant

## 2021-07-21 ENCOUNTER — Other Ambulatory Visit: Payer: Self-pay

## 2021-07-21 ENCOUNTER — Ambulatory Visit
Admission: EM | Admit: 2021-07-21 | Discharge: 2021-07-21 | Disposition: A | Discharge status: Home / Self Care | Payer: Federal, State, Local not specified - PPO | Attending: Emergency Medicine | Admitting: Emergency Medicine

## 2021-07-21 ENCOUNTER — Encounter: Payer: Self-pay | Admitting: Emergency Medicine

## 2021-07-21 DIAGNOSIS — K625 Hemorrhage of anus and rectum: Secondary | ICD-10-CM | POA: Diagnosis not present

## 2021-07-21 LAB — OCCULT BLOOD X 1 CARD TO LAB, STOOL: Fecal Occult Bld: NEGATIVE

## 2021-07-21 NOTE — ED Triage Notes (Signed)
Pt presents today with c/o blood (bright red streak) noticed on stool when having a BM yesterday. Denies n/v/d.

## 2021-07-21 NOTE — ED Provider Notes (Signed)
[facility]    CSN: [id number] Arrival date & time: 07/21/21  0858      History   Chief Complaint Chief Complaint  Patient presents with   Rectal Bleeding    HPI Michael Vega is a 25 y.o. male.   HPI  25 year old male here for evaluation of bloody stool.  Patient reports that when he had a bowel movement yesterday he noticed a red streak on the side of his stool in the toilet.  He states this only happened 1 time, did not notice any blood when he wiped, and the water did not turn red.  He has not had any further episodes and denies any history of rectal bleeding.  He also denies rectal itching or abdominal pain.  He was taking meloxicam but has not taken any of that recently.  Patient has no history of hemorrhoids either.  In addition, patient also states that he was masturbating last night and noticed blood in his ejaculate.  He denies any pain with urination, pain in his testicles, or recent sexual activity.  Past Medical History:  Diagnosis Date   Asthma    Depression    Schizophrenia (HCC)    Schizophrenia, paranoid (HCC)    Seasonal allergies     Patient Active Problem List   Diagnosis Date Noted   Pneumonia 04/03/2019   MDD (major depressive disorder), recurrent, in full remission (HCC) 03/09/2015   Paranoid schizophrenia (HCC) 08/04/2014    Past Surgical History:  Procedure Laterality Date   WISDOM TOOTH EXTRACTION         Home Medications    Prior to Admission medications   Medication Sig Start Date End Date Taking? Authorizing Provider  ARIPiprazole (ABILIFY) 2 MG tablet Take 2 tablets (4 mg total) by mouth daily. 06/23/21   Oletta Darter, MD  budesonide (PULMICORT) 0.5 MG/2ML nebulizer solution Inhale into the lungs 2 (two) times daily. 04/11/19 04/10/20  [provider]  budesonide-formoterol (SYMBICORT) 160-4.5 MCG/ACT inhaler Inhale 2 puffs into the lungs in the morning and at bedtime. 11/20/19   [provider]   EPINEPHrine 0.3 mg/0.3 mL IJ SOAJ injection Inject 0.3 mg into the muscle as needed. 08/12/19   [provider]  loratadine (CLARITIN) 10 MG tablet Take 10 mg by mouth daily as needed for allergies.    [provider]  meloxicam (MOBIC) 7.5 MG tablet TAKE 1 TABLET BY MOUTH EVERY DAY 06/14/20   Trey Sailors, PA-C  metFORMIN (GLUCOPHAGE-XR) 500 MG 24 hr tablet TAKE 1 TABLET BY MOUTH EVERY DAY WITH BREAKFAST 12/01/20   Osvaldo Angst M, PA-C  propranolol (INDERAL) 40 MG tablet Take 1 tablet (40 mg total) by mouth 2 (two) times daily. 01/03/21   Malva Limes, MD  tadalafil (CIALIS) 5 MG tablet Take 1 tablet (5 mg total) by mouth daily as needed for erectile dysfunction. 06/23/21   Oletta Darter, MD    Family History Family History  Problem Relation Age of Onset   Healthy Mother    Healthy Father    Cirrhosis Paternal Grandfather    ADD / ADHD Neg Hx    Alcohol abuse Neg Hx    Anxiety disorder Neg Hx    Bipolar disorder Neg Hx    Depression Neg Hx    Schizophrenia Neg Hx     Social History Social History   Tobacco Use   Smoking status: Never   Smokeless tobacco: Never  Vaping Use   Vaping Use: Never used  Substance Use Topics   Alcohol use: No    Comment: 1-2 alcoholic drinks socially- maybe 1x/month or less   Drug use: No     Allergies   Patient has no known allergies.   Review of Systems Review of Systems  Constitutional:  Negative for activity change, appetite change and fever.  Gastrointestinal:  Positive for blood in stool. Negative for abdominal pain and anal bleeding.  Genitourinary:  Negative for dysuria, frequency, hematuria, penile discharge, penile pain, penile swelling, scrotal swelling, testicular pain and urgency.  Skin:  Negative for rash.    Physical Exam Triage Vital Signs ED Triage Vitals  Enc Vitals Group     BP 07/21/21 1032 120/83     Pulse Rate 07/21/21 1032 77     Resp 07/21/21 1032 18     Temp 07/21/21 1032 98 F  (36.7 C)     Temp Source 07/21/21 1032 Oral     SpO2 07/21/21 1032 97 %     Weight --      Height --      Head Circumference --      Peak Flow --      Pain Score 07/21/21 1030 0     Pain Loc --      Pain Edu? --      Excl. in GC? --    No data found.  Updated Vital Signs BP 120/83 (BP Location: Right Arm)   Pulse 77   Temp 98 F (36.7 C) (Oral)   Resp 18   SpO2 97%   Visual Acuity Right Eye Distance:   Left Eye Distance:   Bilateral Distance:    Right Eye Near:   Left Eye Near:    Bilateral Near:     Physical Exam Vitals and nursing note reviewed.  Constitutional:      General: He is not in acute distress.    Appearance: Normal appearance. He is not ill-appearing.  HENT:     Head: Normocephalic and atraumatic.  Genitourinary:    Penis: Normal.      Testes: Normal.     Prostate: Normal.     Rectum: Normal.  Skin:    General: Skin is warm and dry.     Capillary Refill: Capillary refill takes less than 2 seconds.     Findings: No erythema, lesion or rash.  Neurological:     General: No focal deficit present.     Mental Status: He is alert and oriented to person, place, and time.  Psychiatric:        Mood and Affect: Mood normal.        Behavior: Behavior normal.        Thought Content: Thought content normal.        Judgment: Judgment normal.     UC Treatments / Results  Labs (all labs ordered are listed, but only abnormal results are displayed) Labs Reviewed  OCCULT BLOOD X 1 CARD TO LAB, STOOL    EKG   Radiology No results found.  Procedures Procedures (including critical care time)  Medications Ordered in UC Medications - No data to display  Initial Impression / Assessment and Plan / UC Course  I have reviewed the triage vital signs and the nursing notes.  Pertinent labs & imaging results that were available during my care of the patient were reviewed by me and considered in my medical decision making (see chart for details).  Patient  is a nontoxic-appearing 65 old male here for evaluation  of rectal bleeding and blood in his ejaculate that started yesterday.  No history of either previously.  He denies any pain with urination, painful testicles, penile discharge, rashes, or swelling in his groin.  He also denies any history of rectal bleeding, history of hemorrhoids, rectal itching, or abdominal pain.  He had 1 episode of a red streak on his stool that did not turn the water red and there was no blood when he wiped.  He denies eating anything red in the days prior to having this bowel movement.  He has not had any further episodes.  Patient's physical exam reveals a benign genital exam.  There are no chancres, rashes, or lesions on the penis or scrotum.  Testicles are smooth in texture and nontender.  There is no tenderness, bogginess, or edema to either epididymis or spermatic cord.  No hydrocele appreciated or varicocele appreciated on exam.  No hernia.  Rectal exam reveals no external hemorrhoids and there is no palpable internal hemorrhoids.  No fissures evident and no rectal bleeding.  Digital rectal exam did not reveal any gross blood.  Prostate is normal in size and texture and is nontender.  Stool guaiac collected and sent to lab.  Stool guaiac is negative for blood.  It is unclear as to what caused the red streak in the patient's stool but it does not appear to be a rectal bleeding.  There is no internal or external hemorrhoids visualized or palpated on exam.  Likewise, it is unclear as to what caused the blood in the patient's ejaculate.  Both these episodes have resolved.  I will give the patient reassurance and have him follow-up with his primary care provider for any repeat of symptoms.   Final Clinical Impressions(s) / UC Diagnoses   Final diagnoses:  Rectal bleeding     Discharge Instructions      Your examination today was reassuring as there was no evidence of either internal or external hemorrhoids on your  physical exam.  You have no bleeding from your rectum now and your stool test was negative for blood.  The source of blood in the ejaculate can be many things including inflammation or rough manipulation of the genitals during intercourse or masturbation.  Neither of these conditions seem to be acute or concerning at this time.  If you have a repeat of symptoms I suggest you follow-up with your primary care provider.  You are also if you to return here for reevaluation.     ED Prescriptions   None    PDMP not reviewed this encounter.   Becky Augusta, NP 07/21/21 1116

## 2021-07-21 NOTE — Discharge Instructions (Addendum)
Your examination today was reassuring as there was no evidence of either internal or external hemorrhoids on your physical exam.  You have no bleeding from your rectum now and your stool test was negative for blood.  The source of blood in the ejaculate can be many things including inflammation or rough manipulation of the genitals during intercourse or masturbation.  Neither of these conditions seem to be acute or concerning at this time.  If you have a repeat of symptoms I suggest you follow-up with your primary care provider.  You are also if you to return here for reevaluation.

## 2021-08-11 ENCOUNTER — Telehealth (HOSPITAL_BASED_OUTPATIENT_CLINIC_OR_DEPARTMENT_OTHER): Payer: Federal, State, Local not specified - PPO | Admitting: Psychiatry

## 2021-08-11 ENCOUNTER — Other Ambulatory Visit: Payer: Self-pay

## 2021-08-11 DIAGNOSIS — F251 Schizoaffective disorder, depressive type: Secondary | ICD-10-CM

## 2021-08-11 DIAGNOSIS — Z79899 Other long term (current) drug therapy: Secondary | ICD-10-CM

## 2021-08-11 MED ORDER — ARIPIPRAZOLE 2 MG PO TABS: 4.0000 mg | ORAL_TABLET | Freq: Every day | ORAL | 0 refills | Status: DC

## 2021-08-11 NOTE — Progress Notes (Signed)
Virtual Visit via Video Note  I connected with Michael Vega on 08/11/21 at  2:00 PM EST by a video enabled telemedicine application and verified that I am speaking with the correct person using two identifiers.  Location: Patient: home Provider: office   I discussed the limitations of evaluation and management by telemedicine and the availability of in person appointments. The patient expressed understanding and agreed to proceed.  History of Present Illness: Michael Vega is doing well. He feels good mentally. He denies depression, anxiety and paranoia. 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 He is active and social. Michael Vega sleep, energy and appetite are good. He denies AVH and paranoia and ideas of reference.    Observations/Objective: Psychiatric Specialty Exam: ROS  There were no vitals taken for this visit.There is no height or weight on file to calculate BMI.  General Appearance: Casual and Neat  Eye Contact:  Good  Speech:  Clear and Coherent and Normal Rate  Volume:  Normal  Mood:  Euthymic  Affect:  Full Range  Thought Process:  Goal Directed, Linear, and Descriptions of Associations: Intact  Orientation:  Full (Time, Place, and Person)  Thought Content:  Logical  Suicidal Thoughts:  No  Homicidal Thoughts:  No  Memory:  Immediate;   Good  Judgement:  Good  Insight:  Good  Psychomotor Activity:  Normal  Concentration:  Concentration: Good  Recall:  Good  Fund of Knowledge:  Good  Language:  Good  Akathisia:  No  Handed:  Right  AIMS (if indicated):     Assets:  Communication Skills Desire for Improvement Financial Resources/Insurance Housing Leisure Time Physical Health Resilience Social Support Talents/Skills Transportation Vocational/Educational  ADL's:  Intact  Cognition:  WNL  Sleep:        Assessment and Plan: Depression screen [facility] 2/9 08/11/2021 12/30/2020 09/08/2020  Decreased Interest 0 0 0  Down, Depressed, Hopeless 0 0 1  PHQ - 2  Score 0 0 1  Altered sleeping - - 0  Tired, decreased energy - - 1  Change in appetite - - 1  Feeling bad or failure about yourself  - - 0  Trouble concentrating - - 0  Moving slowly or fidgety/restless - - 0  Suicidal thoughts - - 0  PHQ-9 Score - - 3  Difficult doing work/chores - - Not difficult at all    Flowsheet Row Video Visit from 08/11/2021 in [facility] from 07/21/2021 in [facility]  Video Visit from 12/30/2020 in [facility]  C-SSRS RISK CATEGORY No Risk No Risk No Risk       - EKG to be done by PCP in December 2022    1. Long term current use of antipsychotic medication - TSH - Lipid panel - CBC - Comprehensive metabolic panel - Hemoglobin A1c - Prolactin  2. Schizoaffective disorder, depressive type (HCC) - ARIPiprazole (ABILIFY) 2 MG tablet; Take 2 tablets (4 mg total) by mouth daily.  Dispense: 180 tablet; Refill: 0   Follow Up Instructions: In 2-3 months or sooner if needed   I discussed the assessment and treatment plan with the patient. The patient was provided an opportunity to ask questions and all were answered. The patient agreed with the plan and demonstrated an understanding of the instructions.   The patient was advised to call back or seek an in-person evaluation if the symptoms worsen or if the condition fails to improve as anticipated.  I provided 9 minutes of non-face-to-face time during this encounter.   Oletta Darter, MD

## 2021-09-08 NOTE — Progress Notes (Deleted)
Complete physical exam   Patient: Michael Vega   DOB: 05/20/1996   25 y.o. Male  MRN: [id number] Visit Date: 09/09/2021  Today's healthcare provider: Mikey Kirschner, PA-C   No chief complaint on file.  Subjective    Michael Vega is a 25 y.o. male who presents today for a complete physical exam.  He reports consuming a {diet types:17450} diet. {Exercise:19826} He generally feels {well/fairly well/poorly:18703}. He reports sleeping {well/fairly well/poorly:18703}. He {does/does not:200015} have additional problems to discuss today.  HPI  ***  Past Medical History:  Diagnosis Date   Asthma    Depression    Schizophrenia (HCC)    Schizophrenia, paranoid ([place])    Seasonal allergies    Past Surgical History:  Procedure Laterality Date   WISDOM TOOTH EXTRACTION     Social History   Socioeconomic History   Marital status: Single    Spouse name: Not on file   Number of children: Not on file   Years of education: Not on file   Highest education level: Not on file  Occupational History   Not on file  Tobacco Use   Smoking status: Never   Smokeless tobacco: Never  Vaping Use   Vaping Use: Never used  Substance and Sexual Activity   Alcohol use: No    Comment: 1-2 alcoholic drinks socially- maybe 1x/month or less   Drug use: No   Sexual activity: Yes  Other Topics Concern   Not on file  Social History Narrative   Not on file   Social Determinants of Health   Financial Resource Strain: Not on file  Food Insecurity: Not on file  Transportation Needs: Not on file  Physical Activity: Not on file  Stress: Not on file  Social Connections: Not on file  Intimate Partner Violence: Not on file   Family Status  Relation Name Status   Mother  Alive   Father  Alive   Sister  Alive   Sister  Alive       half sister   Sister  Alive       half sister   PGF  (Not Specified)   Neg Hx  (Not Specified)   Family History  Problem Relation Age of Onset    Healthy Mother    Healthy Father    Cirrhosis Paternal Grandfather    ADD / ADHD Neg Hx    Alcohol abuse Neg Hx    Anxiety disorder Neg Hx    Bipolar disorder Neg Hx    Depression Neg Hx    Schizophrenia Neg Hx    No Known Allergies  Patient Care Team: Terrilee Croak, Adriana M, PA-C (Inactive) as PCP - General (Physician Assistant)   Medications: Outpatient Medications Prior to Visit  Medication Sig   ARIPiprazole (ABILIFY) 2 MG tablet Take 2 tablets (4 mg total) by mouth daily.   budesonide (PULMICORT) 0.5 MG/2ML nebulizer solution Inhale into the lungs 2 (two) times daily.   budesonide-formoterol (SYMBICORT) 160-4.5 MCG/ACT inhaler Inhale 2 puffs into the lungs in the morning and at bedtime.   EPINEPHrine 0.3 mg/0.3 mL IJ SOAJ injection Inject 0.3 mg into the muscle as needed. (Patient not taking: Reported on 08/11/2021)   loratadine (CLARITIN) 10 MG tablet Take 10 mg by mouth daily as needed for allergies. (Patient not taking: Reported on 08/11/2021)   meloxicam (MOBIC) 7.5 MG tablet TAKE 1 TABLET BY MOUTH EVERY DAY (Patient not taking: Reported on 08/11/2021)   propranolol (INDERAL) 40 MG  tablet Take 1 tablet (40 mg total) by mouth 2 (two) times daily. (Patient not taking: Reported on 08/11/2021)   tadalafil (CIALIS) 5 MG tablet Take 1 tablet (5 mg total) by mouth daily as needed for erectile dysfunction. (Patient not taking: Reported on 08/11/2021)   No facility-administered medications prior to visit.    Review of Systems  Last CBC Lab Results  Component Value Date   WBC 4.6 09/08/2020   HGB 17.0 09/08/2020   HCT 49.9 09/08/2020   MCV 85 09/08/2020   MCH 28.8 09/08/2020   RDW 12.3 09/08/2020   PLT 284 53/29/9242   Last metabolic panel Lab Results  Component Value Date   GLUCOSE 79 09/08/2020   NA 142 09/08/2020   K 4.6 09/08/2020   CL 103 09/08/2020   CO2 22 09/08/2020   BUN 14 09/08/2020   CREATININE 1.14 09/08/2020   GFRNONAA 89 09/08/2020   CALCIUM 10.2  09/08/2020   PROT 7.2 09/08/2020   ALBUMIN 4.7 09/08/2020   LABGLOB 2.5 09/08/2020   AGRATIO 1.9 09/08/2020   BILITOT 0.4 09/08/2020   ALKPHOS 80 09/08/2020   AST 16 09/08/2020   ALT 17 09/08/2020   ANIONGAP 10 01/04/2020   Last lipids Lab Results  Component Value Date   CHOL 197 09/08/2020   HDL 35 (L) 09/08/2020   LDLCALC 131 (H) 09/08/2020   TRIG 174 (H) 09/08/2020   CHOLHDL 5.6 (H) 09/08/2020   Last hemoglobin A1c Lab Results  Component Value Date   HGBA1C 5.1 02/21/2018   Last thyroid functions Lab Results  Component Value Date   TSH 0.762 09/08/2020   Last vitamin D No results found for: 25OHVITD2, 25OHVITD3, VD25OH Last vitamin B12 and Folate No results found for: VITAMINB12, FOLATE    Objective    There were no vitals taken for this visit. BP Readings from Last 3 Encounters:  07/21/21 120/83  09/08/20 127/87  01/04/20 125/70   Wt Readings from Last 3 Encounters:  09/08/20 254 lb 4.8 oz (115.3 kg)  01/04/20 230 lb (104.3 kg)  11/21/19 230 lb (104.3 kg)      Physical Exam  ***  Last depression screening scores PHQ 2/9 Scores 09/08/2020  PHQ - 2 Score 1  PHQ- 9 Score 3  Some encounter information is confidential and restricted. Go to Review Flowsheets activity to see all data.   Last fall risk screening Fall Risk  09/08/2020  Falls in the past year? 1  Number falls in past yr: 0  Injury with Fall? 0  Risk for fall due to : History of fall(s)  Follow up Falls evaluation completed;Education provided;Falls prevention discussed   Last Audit-C alcohol use screening Alcohol Use Disorder Test (AUDIT) 09/08/2020  1. How often do you have a drink containing alcohol? 1  2. How many drinks containing alcohol do you have on a typical day when you are drinking? 0  3. How often do you have six or more drinks on one occasion? 0  AUDIT-C Score 1   A score of 3 or more in women, and 4 or more in men indicates increased risk for alcohol abuse, EXCEPT if  all of the points are from question 1   No results found for any visits on 09/09/21.  Assessment & Plan    Routine Health Maintenance and Physical Exam  Exercise Activities and Dietary recommendations  Goals      Increase physical activity     Reduce portion size  Immunization History  Administered Date(s) Administered   DTaP 08/31/1995, 10/30/1995, 11/12/1996, 03/29/2000   HPV 9-valent 09/11/2017   Hepatitis B 1995[birth year]03[birth year]02, 08/31/1995, 12/26/1995   HiB (PRP-T) 08/31/1995, 10/30/1995, 12/26/1995, 11/12/1996   IPV 08/31/1995, 10/30/1995, 12/26/1995, 11/12/1996, 11/02/1997   Influenza Inj Mdck Quad Pf 07/16/2019   MMR 11/12/1996, 03/29/2000   Meningococcal B, OMV 09/11/2017   Meningococcal Conjugate 09/11/2017   Td 09/11/2017   Tdap 02/19/2007   Varicella 06/26/1996, 02/19/2007    Health Maintenance  Topic Date Due   COVID-19 Vaccine (1) Never done   Pneumococcal Vaccine 27-37 Years old (1 - PCV) Never done   HPV VACCINES (2 - Male 3-dose series) 10/09/2017   INFLUENZA VACCINE  04/25/2021   TETANUS/TDAP  09/12/2027   Hepatitis C Screening  Completed   HIV Screening  Completed    Discussed health benefits of physical activity, and encouraged him to engage in regular exercise appropriate for his age and condition.  ***  No follow-ups on file.     {provider attestation***:1}   Mikey Kirschner, PA-C  [facility] [phone] (phone) [phone] (fax)  [place]

## 2021-09-09 ENCOUNTER — Encounter: Payer: Federal, State, Local not specified - PPO | Admitting: Physician Assistant

## 2021-10-05 ENCOUNTER — Telehealth (HOSPITAL_COMMUNITY): Payer: Self-pay | Admitting: *Deleted

## 2021-10-05 DIAGNOSIS — N538 Other male sexual dysfunction: Secondary | ICD-10-CM

## 2021-10-05 DIAGNOSIS — F251 Schizoaffective disorder, depressive type: Secondary | ICD-10-CM

## 2021-10-05 MED ORDER — ARIPIPRAZOLE 2 MG PO TABS: 4.0000 mg | ORAL_TABLET | Freq: Every day | ORAL | 0 refills | Status: DC

## 2021-10-05 MED ORDER — TADALAFIL 5 MG PO TABS: 5.0000 mg | ORAL_TABLET | Freq: Every day | ORAL | 0 refills | Status: DC | PRN

## 2021-10-05 NOTE — Telephone Encounter (Signed)
[facility] scripts for Abilify 2 mg (4 mg total) and Cialis sent to [facility] as pt has now aged out of parents insurance and was not able to afford last fill at [facility]. Pt has an upcoming appointment on 11/10/21.

## 2021-10-28 ENCOUNTER — Other Ambulatory Visit (HOSPITAL_COMMUNITY): Payer: Self-pay | Admitting: *Deleted

## 2021-10-28 DIAGNOSIS — N538 Other male sexual dysfunction: Secondary | ICD-10-CM

## 2021-11-10 ENCOUNTER — Encounter (HOSPITAL_COMMUNITY): Payer: Self-pay | Admitting: Psychiatry

## 2021-11-10 ENCOUNTER — Telehealth (HOSPITAL_BASED_OUTPATIENT_CLINIC_OR_DEPARTMENT_OTHER): Payer: Self-pay | Admitting: Psychiatry

## 2021-11-10 ENCOUNTER — Other Ambulatory Visit: Payer: Self-pay

## 2021-11-10 DIAGNOSIS — N538 Other male sexual dysfunction: Secondary | ICD-10-CM

## 2021-11-10 DIAGNOSIS — F251 Schizoaffective disorder, depressive type: Secondary | ICD-10-CM

## 2021-11-10 DIAGNOSIS — Z79899 Other long term (current) drug therapy: Secondary | ICD-10-CM

## 2021-11-10 MED ORDER — BUPROPION HCL ER (XL) 150 MG PO TB24: 150.0000 mg | ORAL_TABLET | ORAL | 0 refills | Status: DC

## 2021-11-10 MED ORDER — ARIPIPRAZOLE 2 MG PO TABS: 4.0000 mg | ORAL_TABLET | Freq: Every day | ORAL | 0 refills | Status: DC

## 2021-11-10 NOTE — Progress Notes (Signed)
Virtual Visit via Video Note  I connected with Michael Vega on 11/10/21 at  4:00 PM EST by a video enabled telemedicine application and verified that I am speaking with the correct person using two identifiers.  Location: Patient: home Provider: office   I discussed the limitations of evaluation and management by telemedicine and the availability of in person appointments. The patient expressed understanding and agreed to proceed.  History of Present Illness: "Things have been good with the meds". He denies depression and anxiety.  He is active and denies anhedonia and isolation. He is sleeping well. His appetite is increased and he is gaining weight. He denies paranoia, hallucinations and ideas of reference. He denies SI/HI. He feels good with Abilify but he is suffering from sexual side effects. He has not started Cialis yet.    Observations/Objective: Psychiatric Specialty Exam: ROS  There were no vitals taken for this visit.There is no height or weight on file to calculate BMI.  General Appearance: Neat and Well Groomed  Eye Contact:  Good  Speech:  Clear and Coherent and Normal Rate  Volume:  Normal  Mood:  Euthymic  Affect:  Full Range  Thought Process:  Goal Directed, Linear, and Descriptions of Associations: Intact  Orientation:  Full (Time, Place, and Person)  Thought Content:  Logical  Suicidal Thoughts:  No  Homicidal Thoughts:  No  Memory:  Immediate;   Good  Judgement:  Good  Insight:  Good  Psychomotor Activity:  Normal  Concentration:  Concentration: Good  Recall:  Good  Fund of Knowledge:  Good  Language:  Good  Akathisia:  No  Handed:  Right  AIMS (if indicated):     Assets:  Communication Skills Desire for Improvement Financial Resources/Insurance Housing Leisure Time Resilience Social Support Talents/Skills Transportation Vocational/Educational  ADL's:  Intact  Cognition:  WNL  Sleep:        Assessment and Plan: Depression screen [facility] 2/9  11/10/2021 08/11/2021 12/30/2020 09/08/2020  Decreased Interest 0 0 0 0  Down, Depressed, Hopeless 0 0 0 1  PHQ - 2 Score 0 0 0 1  Altered sleeping - - - 0  Tired, decreased energy - - - 1  Change in appetite - - - 1  Feeling bad or failure about yourself  - - - 0  Trouble concentrating - - - 0  Moving slowly or fidgety/restless - - - 0  Suicidal thoughts - - - 0  PHQ-9 Score - - - 3  Difficult doing work/chores - - - Not difficult at all    Flowsheet Row [facility] Video Visit from 08/11/2021 in [facility] ED from 07/21/2021 in [facility]   C-SSRS RISK CATEGORY No Risk No Risk No Risk       1. Schizoaffective disorder, depressive type (HCC) - ARIPiprazole (ABILIFY) 2 MG tablet; Take 2 tablets (4 mg total) by mouth daily.  Dispense: 180 tablet; Refill: 0 - buPROPion (WELLBUTRIN XL) 150 MG 24 hr tablet; Take 1 tablet (150 mg total) by mouth every morning.  Dispense: 30 tablet; Refill: 0  2. Other male sexual dysfunction - buPROPion (WELLBUTRIN XL) 150 MG 24 hr tablet; Take 1 tablet (150 mg total) by mouth every morning.  Dispense: 30 tablet; Refill: 0  3. Long term current use of antipsychotic medication - CBC - TSH - Lipid panel - Comprehensive metabolic panel - Hemoglobin A1c - Prolactin  Follow Up Instructions: In 2-3 months or sooner if needed   I discussed the assessment and treatment plan with the patient. The patient was provided an opportunity to ask questions and all were answered. The patient agreed with the plan and demonstrated an understanding of the instructions.   The patient was advised to call back or seek an in-person evaluation if the symptoms worsen or if the condition fails to improve as anticipated.  I provided 11 minutes of non-face-to-face time during this encounter.   Oletta Darter, MD

## 2021-12-01 ENCOUNTER — Telehealth (HOSPITAL_BASED_OUTPATIENT_CLINIC_OR_DEPARTMENT_OTHER): Payer: Federal, State, Local not specified - PPO | Admitting: Psychiatry

## 2021-12-01 ENCOUNTER — Other Ambulatory Visit: Payer: Self-pay

## 2021-12-01 DIAGNOSIS — F251 Schizoaffective disorder, depressive type: Secondary | ICD-10-CM

## 2021-12-01 DIAGNOSIS — N538 Other male sexual dysfunction: Secondary | ICD-10-CM

## 2021-12-01 MED ORDER — BUPROPION HCL ER (XL) 150 MG PO TB24: 150.0000 mg | ORAL_TABLET | ORAL | 3 refills | Status: DC

## 2021-12-01 MED ORDER — TADALAFIL 5 MG PO TABS: 5.0000 mg | ORAL_TABLET | Freq: Every day | ORAL | 2 refills | Status: DC | PRN

## 2021-12-01 NOTE — Progress Notes (Signed)
Virtual Visit via Video Note ? ?I connected with Michael Vega on 12/01/21 at  1:30 PM EST by a video enabled telemedicine application and verified that I am speaking with the correct person using two identifiers. ? ?Location: ?Patient: in parked car ?Provider: office ?  ?I discussed the limitations of evaluation and management by telemedicine and the availability of in person appointments. The patient expressed understanding and agreed to proceed. ? ?History of Present Illness: ?Saquan started Wellbutrin about 2 weeks ago. He experienced increased anxiety at first but that has resolved. He is tolerating it. Sleep is good. His appetite has decreased a little and is no longer over eating. He is active and is exercising. He is engaging in school and work as well. Krishi denies depression and anhedonia. He denies SI/HI. He denies VH, paranoia and ideas of reference. He is taking Cialis daily and notices improved erections.  ?  ?Observations/Objective: ?Psychiatric Specialty Exam: ?ROS  ?There were no vitals taken for this visit.There is no height or weight on file to calculate BMI.  ?General Appearance: Casual and Neat  ?Eye Contact:  Good  ?Speech:  Clear and Coherent and Normal Rate  ?Volume:  Normal  ?Mood:  Euthymic  ?Affect:  Full Range  ?Thought Process:  Goal Directed, Linear, and Descriptions of Associations: Intact  ?Orientation:  Full (Time, Place, and Person)  ?Thought Content:  Logical  ?Suicidal Thoughts:  No  ?Homicidal Thoughts:  No  ?Memory:  Immediate;   Good  ?Judgement:  Good  ?Insight:  Good  ?Psychomotor Activity:  Normal  ?Concentration:  Concentration: Good  ?Recall:  Good  ?Fund of Knowledge:  Good  ?Language:  Good  ?Akathisia:  No  ?Handed:  Right  ?AIMS (if indicated):     ?Assets:  Communication Skills ?Desire for Improvement ?Financial Resources/Insurance ?Housing ?Intimacy ?Leisure Time ?Physical Health ?Resilience ?Social  Support ?Talents/Skills ?Transportation ?Vocational/Educational  ?ADL's:  Intact  ?Cognition:  WNL  ?Sleep:     ? ? ? ?Assessment and Plan: ? ?1. Schizoaffective disorder, depressive type (HCC) ?- buPROPion (WELLBUTRIN XL) 150 MG 24 hr tablet; Take 1 tablet (150 mg total) by mouth every morning.  Dispense: 90 tablet; Refill: 3 ? ?2. Other male sexual dysfunction ?- buPROPion (WELLBUTRIN XL) 150 MG 24 hr tablet; Take 1 tablet (150 mg total) by mouth every morning.  Dispense: 90 tablet; Refill: 3 ?- tadalafil (CIALIS) 5 MG tablet; Take 1 tablet (5 mg total) by mouth daily as needed for erectile dysfunction.  Dispense: 30 tablet; Refill: 2 ? ?- continue Abilify 4mg  qD ? ?- reminded to get labs done ? ?Follow Up Instructions: ?In 2-3 months or sooner if needed ?  ?I discussed the assessment and treatment plan with the patient. The patient was provided an opportunity to ask questions and all were answered. The patient agreed with the plan and demonstrated an understanding of the instructions. ?  ?The patient was advised to call back or seek an in-person evaluation if the symptoms worsen or if the condition fails to improve as anticipated. ? ?I provided 9 minutes of non-face-to-face time during this encounter. ? ? ?Charlcie Cradle, MD ? ?

## 2021-12-28 ENCOUNTER — Telehealth (HOSPITAL_COMMUNITY): Payer: Self-pay | Admitting: *Deleted

## 2021-12-28 LAB — COMPREHENSIVE METABOLIC PANEL
ALT: 23 IU/L (ref 0–44)
AST: 23 IU/L (ref 0–40)
Albumin/Globulin Ratio: 2.4 — ABNORMAL HIGH (ref 1.2–2.2)
Albumin: 5 g/dL (ref 4.1–5.2)
Alkaline Phosphatase: 77 IU/L (ref 44–121)
BUN/Creatinine Ratio: 12 (ref 9–20)
BUN: 14 mg/dL (ref 6–20)
Bilirubin Total: 0.3 mg/dL (ref 0.0–1.2)
CO2: 24 mmol/L (ref 20–29)
Calcium: 9.9 mg/dL (ref 8.7–10.2)
Chloride: 102 mmol/L (ref 96–106)
Creatinine, Ser: 1.15 mg/dL (ref 0.76–1.27)
Globulin, Total: 2.1 g/dL (ref 1.5–4.5)
Glucose: 82 mg/dL (ref 70–99)
Potassium: 4.8 mmol/L (ref 3.5–5.2)
Sodium: 140 mmol/L (ref 134–144)
Total Protein: 7.1 g/dL (ref 6.0–8.5)
eGFR: 90 mL/min/{1.73_m2} (ref >59)

## 2021-12-28 LAB — HEMOGLOBIN A1C
Est. average glucose Bld gHb Est-mCnc: 105 mg/dL
Hgb A1c MFr Bld: 5.3 % (ref 4.8–5.6)

## 2021-12-28 LAB — PROLACTIN: Prolactin: 7.9 ng/mL (ref 4.0–15.2)

## 2021-12-28 LAB — LIPID PANEL
Chol/HDL Ratio: 6.3 ratio — ABNORMAL HIGH (ref 0.0–5.0)
Cholesterol, Total: 213 mg/dL — ABNORMAL HIGH (ref 100–199)
HDL: 34 mg/dL — ABNORMAL LOW (ref >39)
LDL Chol Calc (NIH): 127 mg/dL — ABNORMAL HIGH (ref 0–99)
Triglycerides: 289 mg/dL — ABNORMAL HIGH (ref 0–149)
VLDL Cholesterol Cal: 52 mg/dL — ABNORMAL HIGH (ref 5–40)

## 2021-12-28 LAB — CBC
Hematocrit: 50.9 % (ref 37.5–51.0)
Hemoglobin: 17.3 g/dL (ref 13.0–17.7)
MCH: 28.3 pg (ref 26.6–33.0)
MCHC: 34 g/dL (ref 31.5–35.7)
MCV: 83 fL (ref 79–97)
Platelets: 291 10*3/uL (ref 150–450)
RBC: 6.11 x10E6/uL — ABNORMAL HIGH (ref 4.14–5.80)
RDW: 13.1 % (ref 11.6–15.4)
WBC: 5.2 10*3/uL (ref 3.4–10.8)

## 2021-12-28 LAB — TSH: TSH: 0.648 u[IU]/mL (ref 0.450–4.500)

## 2021-12-28 NOTE — Telephone Encounter (Signed)
Lab results received from [facility] collected on 12/27/21. Labs resulted for CMP, CBC, Lipid Panel, HgbA1c, TSH, and Prolactin level. Lipid Panel all High however pt was not fasting for blood draw. A/G Ratio high @ 2.4 and RBC high @ 6.11. Results in Epic for review. ?

## 2022-02-09 ENCOUNTER — Telehealth (HOSPITAL_BASED_OUTPATIENT_CLINIC_OR_DEPARTMENT_OTHER): Payer: Self-pay | Admitting: Psychiatry

## 2022-02-09 DIAGNOSIS — F251 Schizoaffective disorder, depressive type: Secondary | ICD-10-CM

## 2022-02-09 DIAGNOSIS — N538 Other male sexual dysfunction: Secondary | ICD-10-CM

## 2022-02-09 MED ORDER — ARIPIPRAZOLE 2 MG PO TABS: 4.0000 mg | ORAL_TABLET | Freq: Every day | ORAL | 0 refills | Status: DC

## 2022-02-09 NOTE — Progress Notes (Signed)
Virtual Visit via Video Note  I connected with Michael Vega on 02/09/22 at  4:30 PM EDT by a video enabled telemedicine application and verified that I am speaking with the correct person using two identifiers.  Location: Patient: home Provider: office   I discussed the limitations of evaluation and management by telemedicine and the availability of in person appointments. The patient expressed understanding and agreed to proceed.  History of Present Illness: Michael Vega is doing ok for the most part. The Wellbutrin is helping some but he notes she has been shaking his limbs randomly and has an increase in energy and hyper at times. This hyper feeling coming on more over the last few weeks. During this time he is more talkative and voice is louder and he can't sit still. He is taking a CBD gummy and it helps. He is able to handle his day to day to responsibilities. The Wellbutrin did help some with the sexual side effects. Due to the SE he is having he wants to stop the Wellbutrin. Michael Vega is willing to go exercise for weight loss. He denies depression and anhedonia. His sleep is poor this week. He is going to bed at 2-3am and then wakes up at 8am. It is unusual for him to go to bed this late. He is feeling tired but also restlessness. He denies SI/HI. He denies paranoia, ideas of reference and hallucinations. He has only been taking Wellbutrin and thought he was supposed to stop the Abilify. .    Observations/Objective: Psychiatric Specialty Exam: ROS  There were no vitals taken for this visit.There is no height or weight on file to calculate BMI.  General Appearance: Casual and Well Groomed  Eye Contact:  Good  Speech:  Clear and Coherent and Normal Rate  Volume:  Normal  Mood:  Euthymic  Affect:  Full Range  Thought Process:  Goal Directed, Linear, and Descriptions of Associations: Intact  Orientation:  Full (Time, Place, and Person)  Thought Content:  Logical  Suicidal Thoughts:  No   Homicidal Thoughts:  No  Memory:  Immediate;   Good  Judgement:  Good  Insight:  Good  Psychomotor Activity:  Normal  Concentration:  Concentration: Good  Recall:  Good  Fund of Knowledge:  Good  Language:  Good  Akathisia:  No  Handed:  Right  AIMS (if indicated):     Assets:  Communication Skills Desire for Improvement Financial Resources/Insurance Housing Leisure Time Resilience Social Support Talents/Skills Transportation Vocational/Educational  ADL's:  Intact  Cognition:  WNL  Sleep:        Assessment and Plan:     02/09/2022    4:46 PM 11/10/2021    4:14 PM 08/11/2021    2:11 PM 12/30/2020   11:08 AM 09/08/2020    1:39 PM  Depression screen PHQ 2/9  Decreased Interest 0 0 0 0 0  Down, Depressed, Hopeless 0 0 0 0 1  PHQ - 2 Score 0 0 0 0 1  Altered sleeping     0  Tired, decreased energy     1  Change in appetite     1  Feeling bad or failure about yourself      0  Trouble concentrating     0  Moving slowly or fidgety/restless     0  Suicidal thoughts     0  PHQ-9 Score     3  Difficult doing work/chores     Not difficult at all  Flowsheet Row Video Visit from 02/09/2022 in [facility] Video Visit from 11/10/2021 in [facility] Video Visit from 08/11/2021 in [facility]  C-SSRS RISK CATEGORY No Risk No Risk No Risk        Status of current problems: increased anxiety   Meds: D/C Wellbutrin due to SE Continue Cialis is effective so I will continue Restart Abilify 4mg  po qD I advised against the use of CBD OTC products.  1. Schizoaffective disorder, depressive type (HCC) - ARIPiprazole (ABILIFY) 2 MG tablet; Take 2 tablets (4 mg total) by mouth daily.  Dispense: 180 tablet; Refill: 0     Labs: reviewed labs lipids are all elevated - he is trying to diet and has an appointment with PCP   Therapy: brief supportive therapy  provided.   Collaboration of Care: Other none today  Patient/Guardian was advised Release of Information must be obtained prior to any record release in order to collaborate their care with an outside provider. Patient/Guardian was advised if they have not already done so to contact the registration department to sign all necessary forms in order for [place] to release information regarding their care.   Consent: Patient/Guardian gives verbal consent for treatment and assignment of benefits for services provided during this visit. Patient/Guardian expressed understanding and agreed to proceed.    Follow Up Instructions: Follow up in 2-3 weeks or sooner if needed    I discussed the assessment and treatment plan with the patient. The patient was provided an opportunity to ask questions and all were answered. The patient agreed with the plan and demonstrated an understanding of the instructions.   The patient was advised to call back or seek an in-person evaluation if the symptoms worsen or if the condition fails to improve as anticipated.  I provided 17 minutes of non-face-to-face time during this encounter.   Charlcie Cradle, MD

## 2022-02-10 ENCOUNTER — Telehealth (HOSPITAL_COMMUNITY): Payer: Self-pay | Admitting: *Deleted

## 2022-02-10 NOTE — Telephone Encounter (Signed)
WRITER RETURNED PT MESSAGE REGARDING NOT BEING ABLE TO AFFORD HID PRESCRIPTIONS. [facility] CHARGE WAS CLOAE TO $3,000. PT HAS TRANSFERRED SCRIPTS TO [facility] [employer] [facility] ON [address]. IN THE MEANTIME [facility] USED A [employer] CARD AND HE WAS ABLE TO GET #20 WITH MINIMAL COST. FYI.

## 2022-02-23 ENCOUNTER — Telehealth (HOSPITAL_BASED_OUTPATIENT_CLINIC_OR_DEPARTMENT_OTHER): Payer: Self-pay | Admitting: Psychiatry

## 2022-02-23 DIAGNOSIS — F251 Schizoaffective disorder, depressive type: Secondary | ICD-10-CM

## 2022-02-23 NOTE — Progress Notes (Signed)
Virtual Visit via Phone Note  I connected with Michael Vega on 02/23/22 at  4:15 PM EDT by a phone and verified that I am speaking with the correct person using two identifiers.  Location: Patient: home Provider: office   I discussed the limitations of evaluation and management by telemedicine and the availability of in person appointments. The patient expressed understanding and agreed to proceed.  History of Present Illness: "I feel more like myself". Since restarting Abilify he is feeling better and much calmer. He is no longer stressed. He is tolerating it well. He is sleeping well but sometimes wakes up 30 min earlier. Javad denies depression. He denies AVH and paranoia. He is taking online summer classes at [employer] and it is going well. He denies SI/HI.    Observations/Objective: Psychiatric Specialty Exam:    General Appearance: unable to assess  Eye Contact:  unable to assess  Speech:  Clear and Coherent and Normal Rate  Volume:  Normal  Mood:  Euthymic  Affect:  Full Range  Thought Process:  Goal Directed, Linear, and Descriptions of Associations: Intact  Orientation:  Full (Time, Place, and Person)  Thought Content:  Logical  Suicidal Thoughts:  No  Homicidal Thoughts:  No  Memory:  Immediate;   Good  Judgement:  Good  Insight:  Good  Psychomotor Activity: unable to assess  Concentration:  Concentration: Good  Recall:  Good  Fund of Knowledge:  Good  Language:  Good  Akathisia:  unable to assess  Handed:  unable to assess  AIMS (if indicated):     Assets:  Communication Skills Desire for Improvement Financial Resources/Insurance Housing Leisure Time Resilience Social Support Talents/Skills Transportation Vocational/Educational  ADL's:  unable to assess  Cognition:  WNL  Sleep:         Assessment and Plan:     02/23/2022    4:28 PM 02/09/2022    4:46 PM 11/10/2021    4:14 PM 08/11/2021    2:11 PM 12/30/2020   11:08 AM  Depression screen PHQ 2/9   Decreased Interest 0 0 0 0 0  Down, Depressed, Hopeless 0 0 0 0 0  PHQ - 2 Score 0 0 0 0 0    Flowsheet Row Video Visit [facility] 02/23/2022 in [facility] Video Visit from 02/09/2022 in [facility] Video Visit from 11/10/2021 in [facility]  C-SSRS RISK CATEGORY No Risk No Risk No Risk         Status of current problems: stable  Meds: continue Abilify  1. Schizoaffective disorder, depressive type (HCC)     Labs: none today    Therapy: brief supportive therapy provided.   Collaboration of Care: Other none today  Patient/Guardian was advised Release of Information must be obtained prior to any record release in order to collaborate their care with an outside provider. Patient/Guardian was advised if they have not already done so to contact the registration department to sign all necessary forms in order for [place] to release information regarding their care.   Consent: Patient/Guardian gives verbal consent for treatment and assignment of benefits for services provided during this visit. Patient/Guardian expressed understanding and agreed to proceed.    Follow Up Instructions: Follow up in 3 months or sooner if needed    I discussed the assessment and treatment plan with the patient. The patient was provided an opportunity to ask questions and all were answered. The patient agreed with the plan and  demonstrated an understanding of the instructions.   The patient was advised to call back or seek an in-person evaluation if the symptoms worsen or if the condition fails to improve as anticipated.  I provided 8 minutes of non-face-to-face time during this encounter.   Oletta Darter, MD

## 2022-05-09 NOTE — Progress Notes (Deleted)
Complete physical exam   Patient: Michael Vega   DOB: May 26, 1996   26 y.o. Male  MRN: [id number] Visit Date: 05/11/2022  Today's healthcare provider: [name], FNP   No chief complaint on file.  Subjective    Michael Vega is a 26 y.o. male who presents today for a complete physical exam.  He reports consuming a {diet types:17450} diet. {Exercise:19826} He generally feels {well/fairly well/poorly:18703}. He reports sleeping {well/fairly well/poorly:18703}. He {does/does not:200015} have additional problems to discuss today.  HPI  ***  Past Medical History:  Diagnosis Date   Asthma    Depression    Schizophrenia (HCC)    Schizophrenia, paranoid ([place])    Seasonal allergies    Past Surgical History:  Procedure Laterality Date   WISDOM TOOTH EXTRACTION     Social History   Socioeconomic History   Marital status: Single    Spouse name: Not on file   Number of children: Not on file   Years of education: Not on file   Highest education level: Not on file  Occupational History   Not on file  Tobacco Use   Smoking status: Never   Smokeless tobacco: Never  Vaping Use   Vaping Use: Never used  Substance and Sexual Activity   Alcohol use: No    Comment: 1-2 alcoholic drinks socially- maybe 1x/month or less   Drug use: No   Sexual activity: Yes  Other Topics Concern   Not on file  Social History Narrative   Not on file   Social Determinants of Health   Financial Resource Strain: Not on file  Food Insecurity: Not on file  Transportation Needs: Not on file  Physical Activity: Not on file  Stress: Not on file  Social Connections: Not on file  Intimate Partner Violence: Not on file   Family Status  Relation Name Status   Mother  Alive   Father  Alive   Sister  Alive   Sister  Alive       half sister   Sister  Alive       half sister   PGF  (Not Specified)   Neg Hx  (Not Specified)   Family History  Problem Relation Age of Onset    Healthy Mother    Healthy Father    Cirrhosis Paternal Grandfather    ADD / ADHD Neg Hx    Alcohol abuse Neg Hx    Anxiety disorder Neg Hx    Bipolar disorder Neg Hx    Depression Neg Hx    Schizophrenia Neg Hx    No Known Allergies  Patient Care Team: Terrilee Croak, Adriana M, PA-C (Inactive) as PCP - General (Physician Assistant)   Medications: Outpatient Medications Prior to Visit  Medication Sig   ARIPiprazole (ABILIFY) 2 MG tablet Take 2 tablets (4 mg total) by mouth daily.   budesonide (PULMICORT) 0.5 MG/2ML nebulizer solution Inhale into the lungs 2 (two) times daily.   budesonide-formoterol (SYMBICORT) 160-4.5 MCG/ACT inhaler Inhale 2 puffs into the lungs in the morning and at bedtime.   EPINEPHrine 0.3 mg/0.3 mL IJ SOAJ injection Inject 0.3 mg into the muscle as needed. (Patient not taking: Reported on 02/23/2022)   loratadine (CLARITIN) 10 MG tablet Take 10 mg by mouth daily as needed for allergies.   meloxicam (MOBIC) 7.5 MG tablet TAKE 1 TABLET BY MOUTH EVERY DAY (Patient not taking: Reported on 02/23/2022)   predniSONE (DELTASONE) 20 MG tablet Take 20 mg by  mouth daily with breakfast. For 5 days   propranolol (INDERAL) 40 MG tablet Take 1 tablet (40 mg total) by mouth 2 (two) times daily. (Patient not taking: Reported on 02/09/2022)   tadalafil (CIALIS) 5 MG tablet Take 1 tablet (5 mg total) by mouth daily as needed for erectile dysfunction.   No facility-administered medications prior to visit.    Review of Systems  {Labs  Heme  Chem  Endocrine  Serology  Results Review (optional):23779}  Objective    There were no vitals taken for this visit. {Show previous vital signs (optional):23777}   Physical Exam  ***  Last depression screening scores    02/23/2022    4:28 PM 02/09/2022    4:46 PM 11/10/2021    4:14 PM  PHQ 2/9 Scores  PHQ - 2 Score        Information is confidential and restricted. Go to Review Flowsheets to unlock data.   Last fall risk screening     09/08/2020    1:38 PM  Fall Risk   Falls in the past year? 1  Number falls in past yr: 0  Injury with Fall? 0  Risk for fall due to : History of fall(s)  Follow up Falls evaluation completed;Education provided;Falls prevention discussed   Last Audit-C alcohol use screening    09/08/2020    1:38 PM  Alcohol Use Disorder Test (AUDIT)  1. How often do you have a drink containing alcohol? 1  2. How many drinks containing alcohol do you have on a typical day when you are drinking? 0  3. How often do you have six or more drinks on one occasion? 0  AUDIT-C Score 1   A score of 3 or more in women, and 4 or more in men indicates increased risk for alcohol abuse, EXCEPT if all of the points are from question 1   No results found for any visits on 05/11/22.  Assessment & Plan    Routine Health Maintenance and Physical Exam  Exercise Activities and Dietary recommendations  Goals      Increase physical activity     Reduce portion size        Immunization History  Administered Date(s) Administered   DTaP 08/31/1995, 10/30/1995, 11/12/1996, 03/29/2000   HPV 9-valent 09/11/2017   Hepatitis B 04/03/1995, 08/31/1995, 12/26/1995   HiB (PRP-T) 08/31/1995, 10/30/1995, 12/26/1995, 11/12/1996   IPV 08/31/1995, 10/30/1995, 12/26/1995, 11/12/1996, 11/02/1997   Influenza Inj Mdck Quad Pf 07/16/2019   MMR 11/12/1996, 03/29/2000   Meningococcal B, OMV 09/11/2017   Meningococcal Conjugate 09/11/2017   Td 09/11/2017   Tdap 02/19/2007   Varicella 06/26/1996, 02/19/2007    Health Maintenance  Topic Date Due   COVID-19 Vaccine (1) Never done   HPV VACCINES (2 - Male 3-dose series) 10/09/2017   INFLUENZA VACCINE  04/25/2022   TETANUS/TDAP  09/12/2027   Hepatitis C Screening  Completed   HIV Screening  Completed    Discussed health benefits of physical activity, and encouraged him to engage in regular exercise appropriate for his age and condition.  ***  No follow-ups on file.      {provider attestation***:1}   [name], [place] [phone] (phone) [phone] (fax)  [place]

## 2022-05-11 ENCOUNTER — Telehealth (HOSPITAL_BASED_OUTPATIENT_CLINIC_OR_DEPARTMENT_OTHER): Payer: Self-pay | Admitting: Psychiatry

## 2022-05-11 ENCOUNTER — Encounter: Payer: Self-pay | Admitting: Family Medicine

## 2022-05-11 DIAGNOSIS — F251 Schizoaffective disorder, depressive type: Secondary | ICD-10-CM

## 2022-05-11 DIAGNOSIS — Z79899 Other long term (current) drug therapy: Secondary | ICD-10-CM

## 2022-05-11 MED ORDER — ARIPIPRAZOLE 2 MG PO TABS: 4.0000 mg | ORAL_TABLET | Freq: Every day | ORAL | 0 refills | Status: DC

## 2022-05-11 NOTE — Progress Notes (Signed)
Virtual Visit via Video Note  I connected with Michael Vega on 05/11/22 at  3:30 PM EDT by   a video enabled telemedicine application and verified that I am speaking with the correct person using two identifiers.  Location: Patient: in parked car Provider: office   I discussed the limitations of evaluation and management by telemedicine and the availability of in person appointments. The patient expressed understanding and agreed to proceed.  History of Present Illness: Michael Vega just moved into an apartment with his sister recently. The apartment is right now campus. He has been taking better care of his health. Michael Vega is making healthy food choices and is exercising 2-3x/week. He notices it has improved his overall mental health. He denies depression, isolation and anhedonia. Michael Vega is sleeping well and gets about 8 hrs/night. He denies SI/HI. 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 He denies AVH, paranoia and ideas of reference. His medication is working and he denies SE. He has physical with his PCP next week.    Observations/Objective: Psychiatric Specialty Exam: ROS  There were no vitals taken for this visit.There is no height or weight on file to calculate BMI.  General Appearance: Casual and Fairly Groomed  Eye Contact:  Good  Speech:  Clear and Coherent and Normal Rate  Volume:  Normal  Mood:  Euthymic  Affect:  Full Range  Thought Process:  Goal Directed, Linear, and Descriptions of Associations: Intact  Orientation:  Full (Time, Place, and Person)  Thought Content:  Logical  Suicidal Thoughts:  No  Homicidal Thoughts:  No  Memory:  Immediate;   Good  Judgement:  Good  Insight:  Good  Psychomotor Activity:  Normal  Concentration:  Concentration: Good  Recall:  Good  Fund of Knowledge:  Good  Language:  Good  Akathisia:  No  Handed:  Right  AIMS (if indicated):     Assets:  Communication Skills Desire for Improvement Financial  Resources/Insurance Housing Leisure Time Physical Health Resilience Social Support Talents/Skills Transportation Vocational/Educational  ADL's:  Intact  Cognition:  WNL  Sleep:        Assessment and Plan:     05/11/2022    3:35 PM 02/23/2022    4:28 PM 02/09/2022    4:46 PM 11/10/2021    4:14 PM 08/11/2021    2:11 PM  Depression screen PHQ 2/9  Decreased Interest 0 0 0 0 0  Down, Depressed, Hopeless 0 0 0 0 0  PHQ - 2 Score 0 0 0 0 0    Flowsheet Row Video Visit from 05/11/2022 in [facility] Video Visit from 02/23/2022 in [facility] Video Visit from 02/09/2022 in [facility]  C-SSRS RISK CATEGORY No Risk No Risk No Risk          Status of current problems: stable  Meds:  1. Schizoaffective disorder, depressive type (HCC) - ARIPiprazole (ABILIFY) 2 MG tablet; Take 2 tablets (4 mg total) by mouth daily.  Dispense: 180 tablet; Refill: 0  2. Long term current use of antipsychotic medication - CBC - TSH - Lipid panel - Comprehensive metabolic panel - Hemoglobin A1c - Prolactin     Labs: labs and EKG with PCP next week    Therapy: brief supportive therapy provided.    Collaboration of Care: Other none   Patient/Guardian was advised Release of Information must be obtained prior to any record release in order to  collaborate their care with an outside provider. Patient/Guardian was advised if they have not already done so to contact the registration department to sign all necessary forms in order for [place] to release information regarding their care.   Consent: Patient/Guardian gives verbal consent for treatment and assignment of benefits for services provided during this visit. Patient/Guardian expressed understanding and agreed to proceed.      Follow Up Instructions: Follow up in 5-6 months or sooner if needed    I discussed the assessment  and treatment plan with the patient. The patient was provided an opportunity to ask questions and all were answered. The patient agreed with the plan and demonstrated an understanding of the instructions.   The patient was advised to call back or seek an in-person evaluation if the symptoms worsen or if the condition fails to improve as anticipated.  I provided 8 minutes of non-face-to-face time during this encounter.   Oletta Darter, MD

## 2022-05-16 NOTE — Progress Notes (Unsigned)
Complete physical exam  I,Michael Vega,acting as a scribe for Michael Sprout, FNP.,have documented all relevant documentation on the behalf of Michael Sprout, FNP,as directed by  Michael Sprout, FNP while in the presence of Michael Sprout, FNP.   Patient: Michael Vega   DOB: November 15, 1995   26 y.o. Male  MRN: [id number] Visit Date: 05/18/2022  Today's healthcare provider: [name], FNP  Patient presents for new patient visit to establish care.  Introduced to Designer, jewellery role and practice setting.  All questions answered.  Discussed provider/patient relationship and expectations.   Chief Complaint  Patient presents with   Annual Exam   Subjective    [name] is a 26 y.o. male who presents today for a complete physical exam.  He reports consuming a general diet. Gym exercise routine includes treadmill and walking 3 times a week He generally feels well. He reports sleeping well. He does have additional problems to discuss today.  HPI   Past Medical History:  Diagnosis Date   Asthma    Depression    Schizophrenia (HCC)    Schizophrenia, paranoid ([place])    Seasonal allergies    Past Surgical History:  Procedure Laterality Date   WISDOM TOOTH EXTRACTION     Social History   Socioeconomic History   Marital status: Single    Spouse name: Not on file   Number of children: Not on file   Years of education: Not on file   Highest education level: Not on file  Occupational History   Not on file  Tobacco Use   Smoking status: Never   Smokeless tobacco: Never  Vaping Use   Vaping Use: Never used  Substance and Sexual Activity   Alcohol use: No    Comment: 1-2 alcoholic drinks socially- maybe 1x/month or less   Drug use: No   Sexual activity: Yes  Other Topics Concern   Not on file  Social History Narrative   Not on file   Social Determinants of Health   Financial Resource Strain: Not on file  Food Insecurity: Not on file  Transportation  Needs: Not on file  Physical Activity: Not on file  Stress: Not on file  Social Connections: Not on file  Intimate Partner Violence: Not on file   Family Status  Relation Name Status   Mother  Alive   Father  Alive   Sister  Alive   Sister  Alive       half sister   Sister  Alive       half sister   PGF  (Not Specified)   Neg Hx  (Not Specified)   Family History  Problem Relation Age of Onset   Healthy Mother    Healthy Father    Cirrhosis Paternal Grandfather    ADD / ADHD Neg Hx    Alcohol abuse Neg Hx    Anxiety disorder Neg Hx    Bipolar disorder Neg Hx    Depression Neg Hx    Schizophrenia Neg Hx    No Known Allergies  Patient Care Team: Michael Sprout, FNP as PCP - General (Family Medicine)   Medications: Outpatient Medications Prior to Visit  Medication Sig   ARIPiprazole (ABILIFY) 2 MG tablet Take 2 tablets (4 mg total) by mouth daily.   loratadine (CLARITIN) 10 MG tablet Take 10 mg by mouth daily as needed for allergies.   tadalafil (CIALIS) 5 MG tablet Take 1 tablet (  5 mg total) by mouth daily as needed for erectile dysfunction.   [DISCONTINUED] budesonide-formoterol (SYMBICORT) 160-4.5 MCG/ACT inhaler Inhale 2 puffs into the lungs in the morning and at bedtime.   EPINEPHrine 0.3 mg/0.3 mL IJ SOAJ injection Inject 0.3 mg into the muscle as needed. (Patient not taking: Reported on 02/23/2022)   [DISCONTINUED] budesonide (PULMICORT) 0.5 MG/2ML nebulizer solution Inhale into the lungs 2 (two) times daily.   [DISCONTINUED] meloxicam (MOBIC) 7.5 MG tablet TAKE 1 TABLET BY MOUTH EVERY DAY (Patient not taking: Reported on 02/23/2022)   [DISCONTINUED] predniSONE (DELTASONE) 20 MG tablet Take 20 mg by mouth daily with breakfast. For 5 days   [DISCONTINUED] propranolol (INDERAL) 40 MG tablet Take 1 tablet (40 mg total) by mouth 2 (two) times daily. (Patient not taking: Reported on 02/09/2022)   No facility-administered medications prior to visit.    Review of Systems   Skin:  Positive for color change and rash.  All other systems reviewed and are negative.   Objective     BP 122/79 (BP Location: Left Arm, Patient Position: Sitting, Cuff Size: Large)   Pulse 79   Temp 97.9 F (36.6 C) (Oral)   Resp 16   Ht $R'5\' 10"'Ht$  (1.778 m)   Wt 245 lb (111.1 kg)   BMI 35.15 kg/m   Physical Exam Vitals and nursing note reviewed.  Constitutional:      General: He is awake. He is not in acute distress.    Appearance: Normal appearance. He is well-developed and well-groomed. He is obese. He is not ill-appearing, toxic-appearing or diaphoretic.  HENT:     Head: Normocephalic and atraumatic.     Jaw: There is normal jaw occlusion. No trismus, tenderness, swelling or pain on movement.     Salivary Glands: Right salivary gland is not diffusely enlarged or tender. Left salivary gland is not diffusely enlarged or tender.     Right Ear: Hearing, tympanic membrane, ear canal and external ear normal. There is no impacted cerumen.     Left Ear: Hearing, tympanic membrane, ear canal and external ear normal. There is no impacted cerumen.     Ears:     Comments: Excess cerumen noted    Nose: Nose normal. No congestion or rhinorrhea.     Right Turbinates: Not enlarged, swollen or pale.     Left Turbinates: Not enlarged, swollen or pale.     Right Sinus: No maxillary sinus tenderness or frontal sinus tenderness.     Left Sinus: No maxillary sinus tenderness or frontal sinus tenderness.     Mouth/Throat:     Lips: Pink.     Mouth: Mucous membranes are moist. No injury, lacerations, oral lesions or angioedema.     Pharynx: Oropharynx is clear. Uvula midline. No pharyngeal swelling, oropharyngeal exudate or posterior oropharyngeal erythema.     Tonsils: No tonsillar exudate or tonsillar abscesses.  Eyes:     General: Lids are normal. Vision grossly intact. Gaze aligned appropriately.        Right eye: No discharge.        Left eye: No discharge.     Extraocular Movements:  Extraocular movements intact.     Conjunctiva/sclera: Conjunctivae normal.     Pupils: Pupils are equal, round, and reactive to light.  Neck:     Thyroid: No thyroid mass, thyromegaly or thyroid tenderness.     Vascular: No carotid bruit.     Trachea: Trachea normal. No tracheal tenderness.      Comments: Complaints of  neck skin discoloration; check a1c  Cardiovascular:     Rate and Rhythm: Normal rate and regular rhythm.     Pulses: Normal pulses.          Carotid pulses are 2+ on the right side and 2+ on the left side.      Radial pulses are 2+ on the right side and 2+ on the left side.       Femoral pulses are 2+ on the right side and 2+ on the left side.      Popliteal pulses are 2+ on the right side and 2+ on the left side.       Dorsalis pedis pulses are 2+ on the right side and 2+ on the left side.       Posterior tibial pulses are 2+ on the right side and 2+ on the left side.     Heart sounds: Normal heart sounds, S1 normal and S2 normal. No murmur heard.    No friction rub. No gallop.  Pulmonary:     Effort: Pulmonary effort is normal. No respiratory distress.     Breath sounds: Normal breath sounds and air entry. No stridor. No wheezing, rhonchi or rales.  Chest:     Chest wall: No tenderness.     Comments: Complaints of breast enlargement; check prolactin  Abdominal:     General: Abdomen is flat. Bowel sounds are normal. There is no distension.     Palpations: Abdomen is soft. There is no mass.     Tenderness: There is no abdominal tenderness. There is no guarding or rebound.     Hernia: No hernia is present.  Genitourinary:    Comments: Exam deferred; endorses ED Musculoskeletal:        General: No swelling, tenderness, deformity or signs of injury. Normal range of motion.     Cervical back: Normal range of motion and neck supple. No rigidity or tenderness.     Right lower leg: No edema.     Left lower leg: No edema.  Lymphadenopathy:     Cervical: No cervical  adenopathy.     Right cervical: No superficial, deep or posterior cervical adenopathy.    Left cervical: No superficial, deep or posterior cervical adenopathy.  Skin:    General: Skin is warm and dry.     Capillary Refill: Capillary refill takes less than 2 seconds.     Coloration: Skin is not jaundiced or pale.     Findings: Rash present. No bruising, erythema or lesion.     Comments: Folliculitis rash to bilateral arms, chest, back Endorses scalp plaquing with associated hair loss  Neurological:     General: No focal deficit present.     Mental Status: He is alert and oriented to person, place, and time. Mental status is at baseline.     GCS: GCS eye subscore is 4. GCS verbal subscore is 5. GCS motor subscore is 6.     Sensory: Sensation is intact. No sensory deficit.     Motor: Motor function is intact. No weakness.     Coordination: Coordination is intact.     Gait: Gait is intact.  Psychiatric:        Attention and Perception: Attention and perception normal.        Mood and Affect: Mood and affect normal.        Speech: Speech normal.        Behavior: Behavior normal. Behavior is cooperative.  Thought Content: Thought content normal.        Cognition and Memory: Cognition normal.        Judgment: Judgment normal.     Last depression screening scores    05/11/2022    3:35 PM 02/23/2022    4:28 PM 02/09/2022    4:46 PM  PHQ 2/9 Scores  PHQ - 2 Score 0 0 0   Last fall risk screening    05/18/2022   10:12 AM  [place] in the past year? 0  Number falls in past yr: 0  Injury with Fall? 0  Risk for fall due to : No Fall Risks   Last Audit-C alcohol use screening    05/18/2022   10:13 AM  Alcohol Use Disorder Test (AUDIT)  1. How often do you have a drink containing alcohol? 1  2. How many drinks containing alcohol do you have on a typical day when you are drinking? 0  3. How often do you have six or more drinks on one occasion? 0  AUDIT-C Score 1   A  score of 3 or more in women, and 4 or more in men indicates increased risk for alcohol abuse, EXCEPT if all of the points are from question 1   No results found for any visits on 05/18/22.  Assessment & Plan    Routine Health Maintenance and Physical Exam  Exercise Activities and Dietary recommendations  Goals      Increase physical activity     Reduce portion size        Immunization History  Administered Date(s) Administered   DTaP 08/31/1995, 10/30/1995, 11/12/1996, 03/29/2000   HPV 9-valent 09/11/2017   Hepatitis B 04/07/1995, 08/31/1995, 12/26/1995   HiB (PRP-T) 08/31/1995, 10/30/1995, 12/26/1995, 11/12/1996   IPV 08/31/1995, 10/30/1995, 12/26/1995, 11/12/1996, 11/02/1997   Influenza Inj Mdck Quad Pf 07/16/2019   MMR 11/12/1996, 03/29/2000   Meningococcal B, OMV 09/11/2017   Meningococcal Conjugate 09/11/2017   Td 09/11/2017   Tdap 02/19/2007   Varicella 06/26/1996, 02/19/2007    Health Maintenance  Topic Date Due   COVID-19 Vaccine (1) Never done   HPV VACCINES (2 - Male 3-dose series) 10/09/2017   INFLUENZA VACCINE  04/25/2022   TETANUS/TDAP  09/12/2027   Hepatitis C Screening  Completed   HIV Screening  Completed    Discussed health benefits of physical activity, and encouraged him to engage in regular exercise appropriate for his age and condition.  Problem List Items Addressed This Visit       Respiratory   Mild persistent asthma without complication    Chronic, worsening with exercise Has been off daily inhaler for control for awhile Wishes to lose weight Has an appt scheduled with pulm No wheezes or rhonchi auscultated       Relevant Medications   Fluticasone-Umeclidin-Vilant (TRELEGY ELLIPTA) 100-62.5-25 MCG/ACT AEPB     Musculoskeletal and Integument   Acanthosis nigricans    Chronic, stable Recommend A1c check       Chronic pruritic rash in adult    Chronic, not improving Appears to be folliculitis Recommend trial of doxy 100 mg BID  to assist Encourage exfoliation for concern for KP      Relevant Medications   doxycycline (MONODOX) 50 MG capsule   Other Relevant Orders   ANA Direct w/Reflex if Positive   C-reactive protein   Sed Rate (ESR)   Scalp alopecia    Chronic, worsening Reports plaques as well Wears a loose fitting hat  most days Recommend trial of topical steroid medication to assist       Relevant Medications   clobetasol (TEMOVATE) 0.05 % external solution   Other Relevant Orders   ANA Direct w/Reflex if Positive   C-reactive protein   Sed Rate (ESR)     Other   Annual physical exam - Primary    UTD on vision and dental Finishing sociology program at [place] to do to keep yourself healthy  - Exercise at least 30-45 minutes a day, 3-4 days a week.  - Eat a low-fat diet with lots of fruits and vegetables, up to 7-9 servings per day.  - Seatbelts can save your life. Wear them always.  - Smoke detectors on every level of your home, check batteries every year.  - Eye Doctor - have an eye exam every 1-2 years  - Safe sex - if you may be exposed to STDs, use a condom.  - Alcohol -  If you drink, do it moderately, less than 2 drinks per day.  - [place]. Choose someone to speak for you if you are not able.  - Depression is common in our stressful world.If you're feeling down or losing interest in things you normally enjoy, please come in for a visit.  - Violence - If anyone is threatening or hurting you, please call immediately.        Relevant Orders   Comprehensive metabolic panel   CBC with Differential/Platelet   TSH + free T4   Lipid panel   Depression, recurrent (HCC)    Chronic, stable On 2 tablets, Abilify 2 mg Followed by psych       Relevant Orders   Testosterone,Free and Total   Prolactin   EKG 12-Lead   Erectile dysfunction    Chronic, stable Recommend hormone testing May be side effect of obesity or antidepressants       Relevant Orders    Testosterone,Free and Total   Prolactin   Family history of lupus erythematosus    Recommend screening labs for inflammatory process with current rash, although non traditional rash (ie not malar)      Relevant Orders   ANA Direct w/Reflex if Positive   C-reactive protein   Sed Rate (ESR)   High risk medication use    Recommend EKG given chronic psych medication use       Relevant Orders   EKG 12-Lead   Paranoid schizophrenia (HCC)    Chronic, stable F/b psych Denies recent paranoia       Relevant Orders   Prolactin   EKG 12-Lead   Screening for diabetes mellitus    Hx of Obesity Without HTN Recommend screening A1c given presence of acanthosis nigricans       Relevant Orders   Hemoglobin A1c     Return in about 6 months (around 11/18/2022) for chonic disease management.     Vonna Kotyk, FNP, have reviewed all documentation for this visit. The documentation on 05/18/22 for the exam, diagnosis, procedures, and orders are all accurate and complete.    Michael Vega, [place] [phone] (phone) [phone] (fax)  [place]

## 2022-05-17 ENCOUNTER — Telehealth (HOSPITAL_COMMUNITY): Payer: Self-pay | Admitting: *Deleted

## 2022-05-17 NOTE — Telephone Encounter (Signed)
PA FOR ABILIY 2MG  BID SUBMITTED TO BCBS VIA COVER MY MEDS.   PA REQUIRED FOR QUANTITY LIMITATIONS.  KEY: [id number].  AWAITING DETERMINATION.

## 2022-05-17 NOTE — Telephone Encounter (Signed)
PA FOR ABILIFY 2 MG TABS BID APPROVED BY BCBS.   APPROVED FROM 05/17/22 THROUGH 05/16/23.  PT AND PHARMACY NOTIFIED.

## 2022-05-18 ENCOUNTER — Encounter: Payer: Self-pay | Admitting: Family Medicine

## 2022-05-18 ENCOUNTER — Ambulatory Visit (INDEPENDENT_AMBULATORY_CARE_PROVIDER_SITE_OTHER): Payer: BC Managed Care – PPO | Admitting: Family Medicine

## 2022-05-18 VITALS — BP 122/79 | HR 79 | Temp 97.9°F | Resp 16 | Ht 70.0 in | Wt 245.0 lb

## 2022-05-18 DIAGNOSIS — F339 Major depressive disorder, recurrent, unspecified: Secondary | ICD-10-CM | POA: Diagnosis not present

## 2022-05-18 DIAGNOSIS — Z131 Encounter for screening for diabetes mellitus: Secondary | ICD-10-CM | POA: Diagnosis not present

## 2022-05-18 DIAGNOSIS — L83 Acanthosis nigricans: Secondary | ICD-10-CM

## 2022-05-18 DIAGNOSIS — L298 Other pruritus: Secondary | ICD-10-CM

## 2022-05-18 DIAGNOSIS — L2989 Other pruritus: Secondary | ICD-10-CM

## 2022-05-18 DIAGNOSIS — N529 Male erectile dysfunction, unspecified: Secondary | ICD-10-CM | POA: Diagnosis not present

## 2022-05-18 DIAGNOSIS — Z840 Family history of diseases of the skin and subcutaneous tissue: Secondary | ICD-10-CM

## 2022-05-18 DIAGNOSIS — Z79899 Other long term (current) drug therapy: Secondary | ICD-10-CM

## 2022-05-18 DIAGNOSIS — Z0000 Encounter for general adult medical examination without abnormal findings: Secondary | ICD-10-CM

## 2022-05-18 DIAGNOSIS — L659 Nonscarring hair loss, unspecified: Secondary | ICD-10-CM | POA: Diagnosis not present

## 2022-05-18 DIAGNOSIS — F200 Paranoid schizophrenia: Secondary | ICD-10-CM | POA: Diagnosis not present

## 2022-05-18 DIAGNOSIS — J4530 Mild persistent asthma, uncomplicated: Secondary | ICD-10-CM

## 2022-05-18 MED ORDER — TRELEGY ELLIPTA 100-62.5-25 MCG/ACT IN AEPB: 1.0000 | INHALATION_SPRAY | Freq: Two times a day (BID) | RESPIRATORY_TRACT | 11 refills | Status: DC

## 2022-05-18 MED ORDER — DOXYCYCLINE MONOHYDRATE 50 MG PO CAPS: 100.0000 mg | ORAL_CAPSULE | Freq: Two times a day (BID) | ORAL | 0 refills | Status: DC

## 2022-05-18 MED ORDER — CLOBETASOL PROPIONATE 0.05 % EX SOLN: 1.0000 | Freq: Two times a day (BID) | CUTANEOUS | 11 refills | Status: DC

## 2022-05-18 NOTE — Assessment & Plan Note (Signed)
UTD on vision and dental Finishing sociology program at [facility] Things to do to keep yourself healthy  - Exercise at least 30-45 minutes a day, 3-4 days a week.  - Eat a low-fat diet with lots of fruits and vegetables, up to 7-9 servings per day.  - Seatbelts can save your life. Wear them always.  - Smoke detectors on every level of your home, check batteries every year.  - Eye Doctor - have an eye exam every 1-2 years  - Safe sex - if you may be exposed to STDs, use a condom.  - Alcohol -  If you drink, do it moderately, less than 2 drinks per day.  - Health Care Power of Attorney. Choose someone to speak for you if you are not able.  - Depression is common in our stressful world.If you're feeling down or losing interest in things you normally enjoy, please come in for a visit.  - Violence - If anyone is threatening or hurting you, please call immediately.

## 2022-05-18 NOTE — Assessment & Plan Note (Signed)
Chronic, worsening with exercise Has been off daily inhaler for control for awhile Wishes to lose weight Has an appt scheduled with pulm No wheezes or rhonchi auscultated

## 2022-05-18 NOTE — Assessment & Plan Note (Signed)
Chronic, worsening Reports plaques as well Wears a loose fitting hat most days Recommend trial of topical steroid medication to assist

## 2022-05-18 NOTE — Assessment & Plan Note (Signed)
Recommend screening labs for inflammatory process with current rash, although non traditional rash (ie not malar)

## 2022-05-18 NOTE — Assessment & Plan Note (Signed)
Recommend EKG given chronic psych medication use

## 2022-05-18 NOTE — Assessment & Plan Note (Deleted)
Chronic, stable On 2 tablets, Abilify 2 mg Followed by psych

## 2022-05-18 NOTE — Assessment & Plan Note (Signed)
Chronic, stable On 2 tablets, Abilify 2 mg Followed by psych  

## 2022-05-18 NOTE — Assessment & Plan Note (Signed)
Chronic, stable Recommend A1c check

## 2022-05-18 NOTE — Assessment & Plan Note (Signed)
Chronic, stable Recommend hormone testing May be side effect of obesity or antidepressants

## 2022-05-18 NOTE — Assessment & Plan Note (Signed)
Chronic, not improving Appears to be folliculitis Recommend trial of doxy 100 mg BID to assist Encourage exfoliation for concern for KP

## 2022-05-18 NOTE — Assessment & Plan Note (Signed)
Chronic, stable F/b psych Denies recent paranoia

## 2022-05-18 NOTE — Assessment & Plan Note (Signed)
Hx of Obesity Without HTN Recommend screening A1c given presence of acanthosis nigricans

## 2022-05-19 DIAGNOSIS — J4540 Moderate persistent asthma, uncomplicated: Secondary | ICD-10-CM | POA: Diagnosis not present

## 2022-05-19 NOTE — Progress Notes (Signed)
Borderline elevation noted in Potassium, with consistent elevation in Red blood cells. No further work up needed at this time.   Cholesterol is improved; however good/HDL cholesterol remains low and bad/LDL remains slightly elevated. I recommend diet low in saturated fat and regular exercise - 30 min at least 5 times per week  Prolactin is low- please seek counsel of your psychiatry provider. Normal testosterone.   Negative inflammatory markers.  Jacky Kindle, FNP  [facility] [address] [place], [place] [address] [phone] (phone) [phone] (fax) [facility]

## 2022-05-27 LAB — COMPREHENSIVE METABOLIC PANEL
ALT: 17 IU/L (ref 0–44)
AST: 16 IU/L (ref 0–40)
Albumin/Globulin Ratio: 2.3 — ABNORMAL HIGH (ref 1.2–2.2)
Albumin: 4.9 g/dL (ref 4.3–5.2)
Alkaline Phosphatase: 64 IU/L (ref 44–121)
BUN/Creatinine Ratio: 10 (ref 9–20)
BUN: 11 mg/dL (ref 6–20)
Bilirubin Total: 0.4 mg/dL (ref 0.0–1.2)
CO2: 24 mmol/L (ref 20–29)
Calcium: 10.2 mg/dL (ref 8.7–10.2)
Chloride: 103 mmol/L (ref 96–106)
Creatinine, Ser: 1.15 mg/dL (ref 0.76–1.27)
Globulin, Total: 2.1 g/dL (ref 1.5–4.5)
Glucose: 85 mg/dL (ref 70–99)
Potassium: 5.3 mmol/L — ABNORMAL HIGH (ref 3.5–5.2)
Sodium: 140 mmol/L (ref 134–144)
Total Protein: 7 g/dL (ref 6.0–8.5)
eGFR: 90 mL/min/{1.73_m2} (ref >59)

## 2022-05-27 LAB — CBC WITH DIFFERENTIAL/PLATELET
Basophils Absolute: 0.1 10*3/uL (ref 0.0–0.2)
Basos: 1 %
EOS (ABSOLUTE): 0.1 10*3/uL (ref 0.0–0.4)
Eos: 1 %
Hematocrit: 49.8 % (ref 37.5–51.0)
Hemoglobin: 17 g/dL (ref 13.0–17.7)
Immature Grans (Abs): 0 10*3/uL (ref 0.0–0.1)
Immature Granulocytes: 1 %
Lymphocytes Absolute: 1.2 10*3/uL (ref 0.7–3.1)
Lymphs: 28 %
MCH: 28.8 pg (ref 26.6–33.0)
MCHC: 34.1 g/dL (ref 31.5–35.7)
MCV: 84 fL (ref 79–97)
Monocytes Absolute: 0.3 10*3/uL (ref 0.1–0.9)
Monocytes: 7 %
Neutrophils Absolute: 2.6 10*3/uL (ref 1.4–7.0)
Neutrophils: 62 %
Platelets: 293 10*3/uL (ref 150–450)
RBC: 5.9 x10E6/uL — ABNORMAL HIGH (ref 4.14–5.80)
RDW: 13.1 % (ref 11.6–15.4)
WBC: 4.2 10*3/uL (ref 3.4–10.8)

## 2022-05-27 LAB — TSH+FREE T4
Free T4: 1.27 ng/dL (ref 0.82–1.77)
TSH: 0.769 u[IU]/mL (ref 0.450–4.500)

## 2022-05-27 LAB — LIPID PANEL
Chol/HDL Ratio: 4.6 ratio (ref 0.0–5.0)
Cholesterol, Total: 173 mg/dL (ref 100–199)
HDL: 38 mg/dL — ABNORMAL LOW (ref >39)
LDL Chol Calc (NIH): 118 mg/dL — ABNORMAL HIGH (ref 0–99)
Triglycerides: 94 mg/dL (ref 0–149)
VLDL Cholesterol Cal: 17 mg/dL (ref 5–40)

## 2022-05-27 LAB — TESTOSTERONE,FREE AND TOTAL
Testosterone, Free: 15.4 pg/mL (ref 9.3–26.5)
Testosterone: 522 ng/dL (ref 264–916)

## 2022-05-27 LAB — HEMOGLOBIN A1C
Est. average glucose Bld gHb Est-mCnc: 114 mg/dL
Hgb A1c MFr Bld: 5.6 % (ref 4.8–5.6)

## 2022-05-27 LAB — C-REACTIVE PROTEIN: CRP: <1 mg/L (ref 0–10)

## 2022-05-27 LAB — ANA W/REFLEX IF POSITIVE: Anti Nuclear Antibody (ANA): NEGATIVE

## 2022-05-27 LAB — SEDIMENTATION RATE: Sed Rate: 2 mm/hr (ref 0–15)

## 2022-05-27 LAB — PROLACTIN: Prolactin: 2.6 ng/mL — ABNORMAL LOW (ref 4.0–15.2)

## 2022-09-13 DIAGNOSIS — J4540 Moderate persistent asthma, uncomplicated: Secondary | ICD-10-CM | POA: Diagnosis not present

## 2022-10-26 ENCOUNTER — Telehealth (HOSPITAL_COMMUNITY): Payer: Medicaid Other | Admitting: Psychiatry

## 2022-10-26 ENCOUNTER — Encounter (HOSPITAL_COMMUNITY): Payer: Self-pay | Admitting: Psychiatry

## 2022-10-26 DIAGNOSIS — F251 Schizoaffective disorder, depressive type: Secondary | ICD-10-CM | POA: Diagnosis not present

## 2022-10-26 MED ORDER — ARIPIPRAZOLE 2 MG PO TABS: 4.0000 mg | ORAL_TABLET | Freq: Every day | ORAL | 0 refills | Status: DC

## 2022-10-26 NOTE — Progress Notes (Signed)
Virtual Visit via Video Note  I connected with Michael Vega on 10/26/22 at  4:30 PM EST by  a video enabled telemedicine application and verified that I am speaking with the correct person using two identifiers.  Location: Patient: in parked car Provider: office   I discussed the limitations of evaluation and management by telemedicine and the availability of in person appointments. The patient expressed understanding and agreed to proceed.  History of Present Illness: "Things have been going good". Ha shares that his mental health has been stable. He denies paranoia, hallucinations and ideas of reference. He is sleeping 7-8 hrs/night. His appetite and energy are good. Michael Vega has been making healthy diet choices. He denies depression and anhedonia. He denies anxiety. Michael Vega is taking his meds daily and feels they are working well. He has not noticed any side effects.    Observations/Objective: Psychiatric Specialty Exam: ROS  There were no vitals taken for this visit.There is no height or weight on file to calculate BMI.  General Appearance: Neat and Well Groomed  Eye Contact:  Good  Speech:  Clear and Coherent and Normal Rate  Volume:  Normal  Mood:  Euthymic  Affect:  Full Range  Thought Process:  Goal Directed, Linear, and Descriptions of Associations: Intact  Orientation:  Full (Time, Place, and Person)  Thought Content:  Logical  Suicidal Thoughts:  No  Homicidal Thoughts:  No  Memory:  Immediate;   Good  Judgement:  Good  Insight:  Good  Psychomotor Activity:  Normal  Concentration:  Concentration: Good  Recall:  Good  Fund of Knowledge:  Good  Language:  Good  Akathisia:  No  Handed:  Right  AIMS (if indicated):     Assets:  Communication Skills Desire for Improvement Financial Resources/Insurance Housing Leisure Time [place] Talents/Skills Transportation Vocational/Educational  ADL's:  Intact  Cognition:  WNL   Sleep:        Assessment and Plan:     05/11/2022    3:35 PM 02/23/2022    4:28 PM 02/09/2022    4:46 PM 11/10/2021    4:14 PM 08/11/2021    2:11 PM  Depression screen PHQ 2/9  Decreased Interest 0 0 0 0 0  Down, Depressed, Hopeless 0 0 0 0 0  PHQ - 2 Score 0 0 0 0 0    Flowsheet Row Video Visit from 05/11/2022 in [facility] Video Visit from 02/23/2022 in [facility] Video Visit from 02/09/2022 in [facility] No Risk No Risk No Risk          Pt is aware that these meds carry a teratogenic risk. Pt will discuss plan of action if she does or plans to become pregnant in the future.  Status of current problems: stable   Medication management with supportive therapy. Risks and benefits, side effects and alternative treatment options discussed with patient. Pt was given an opportunity to ask questions about medication, illness, and treatment. All current psychiatric medications have been reviewed and discussed with the patient and adjusted as clinically appropriate.  Pt verbalized understanding and verbal consent obtained for treatment.  Meds:  1. Schizoaffective disorder, depressive type (HCC) - ARIPiprazole (ABILIFY) 2 MG tablet; Take 2 tablets (4 mg total) by mouth daily.  Dispense: 180 tablet; Refill: 0     Labs: reviewed EKG done on 05/18/22 Qtc 388 05/18/22: TSH, HbA1c, CBC WNL. Prolacin level is  low.- I will recheck at next visit.  CMP shows potassium 5.3- addressed by his PCP    Therapy: brief supportive therapy provided.   Collaboration of Care: Other none  Patient/Guardian was advised Release of Information must be obtained prior to any record release in order to collaborate their care with an outside provider. Patient/Guardian was advised if they have not already done so to contact the registration department to sign all necessary  forms in order for [place] to release information regarding their care.   Consent: Patient/Guardian gives verbal consent for treatment and assignment of benefits for services provided during this visit. Patient/Guardian expressed understanding and agreed to proceed.      Follow Up Instructions: Follow up in 2-3 months or sooner if needed    I discussed the assessment and treatment plan with the patient. The patient was provided an opportunity to ask questions and all were answered. The patient agreed with the plan and demonstrated an understanding of the instructions.   The patient was advised to call back or seek an in-person evaluation if the symptoms worsen or if the condition fails to improve as anticipated.  I provided 10 minutes of non-face-to-face time during this encounter.   Charlcie Cradle, MD

## 2023-01-18 ENCOUNTER — Telehealth (HOSPITAL_BASED_OUTPATIENT_CLINIC_OR_DEPARTMENT_OTHER): Payer: Medicaid Other | Admitting: Psychiatry

## 2023-01-18 ENCOUNTER — Encounter (HOSPITAL_COMMUNITY): Payer: Self-pay | Admitting: Psychiatry

## 2023-01-18 DIAGNOSIS — F251 Schizoaffective disorder, depressive type: Secondary | ICD-10-CM | POA: Diagnosis not present

## 2023-01-18 MED ORDER — ARIPIPRAZOLE 2 MG PO TABS: 4.0000 mg | ORAL_TABLET | Freq: Every day | ORAL | 0 refills | Status: DC

## 2023-01-18 NOTE — Progress Notes (Signed)
Virtual Visit via Video Note  I connected with [name] on 01/18/23 at  4:30 PM EDT by a video enabled telemedicine application and verified that I am speaking with the correct person using two identifiers.  Location: Patient: home Provider: office   I discussed the limitations of evaluation and management by telemedicine and the availability of in person appointments. The patient expressed understanding and agreed to proceed.  History of Present Illness: Antwyne is doing well. He has been focused on school and it is going well. He is able to focus and get his work completed. Finals are starting next week and he feels well prepared. He is looking for a job and is planning on summer classes. Xayvier denies depression, anhedonia, isolation and hopelessness. He denies SI/HI. His sleep, appetite and energy are good. Clem denies paranoia, AVH and ideas of reference. His meds are effective and denies SE.     Observations/Objective: Psychiatric Specialty Exam: ROS  There were no vitals taken for this visit.There is no height or weight on file to calculate BMI.  General Appearance: Neat and Well Groomed  Eye Contact:  Good  Speech:  Clear and Coherent and Normal Rate  Volume:  Normal  Mood:  Euthymic  Affect:  Full Range  Thought Process:  Goal Directed, Linear, and Descriptions of Associations: Intact  Orientation:  Full (Time, Place, and Person)  Thought Content:  Logical  Suicidal Thoughts:  No  Homicidal Thoughts:  No  Memory:  Immediate;   Good  Judgement:  Good  Insight:  Good  Psychomotor Activity:  Normal  Concentration:  Concentration: Good  Recall:  Good  Fund of Knowledge:  Good  Language:  Good  Akathisia:  No  Handed:  Right  AIMS (if indicated):     Assets:  Communication Skills Desire for Improvement Financial Resources/Insurance Housing Leisure Time Physical Health Resilience Social Support Talents/Skills Transportation Vocational/Educational   ADL's:  Intact  Cognition:  WNL  Sleep:        Assessment and Plan:     01/18/2023    4:51 PM 05/11/2022    3:35 PM 02/23/2022    4:28 PM 02/09/2022    4:46 PM 11/10/2021    4:14 PM  Depression screen PHQ 2/9  Decreased Interest 0 0 0 0 0  Down, Depressed, Hopeless 0 0 0 0 0  PHQ - 2 Score 0 0 0 0 0    Flowsheet Row Video Visit from 01/18/2023 in [facility] Video Visit from 05/11/2022 in [facility] Video Visit from 02/23/2022 in [facility]  C-SSRS RISK CATEGORY No Risk No Risk No Risk          Status of current problems: stable   Medication management with supportive therapy. Risks and benefits, side effects and alternative treatment options discussed with patient. Pt was given an opportunity to ask questions about medication, illness, and treatment. All current psychiatric medications have been reviewed and discussed with the patient and adjusted as clinically appropriate.  Pt verbalized understanding and verbal consent obtained for treatment.  Meds:  1. Schizoaffective disorder, depressive type - ARIPiprazole (ABILIFY) 2 MG tablet; Take 2 tablets (4 mg total) by mouth daily.  Dispense: 180 tablet; Refill: 0     Labs: will needs labs in August 2024    Therapy: brief supportive therapy provided. Discussed psychosocial stressors in detail.     Collaboration of Care: Other none  Patient/Guardian was advised Release of  Information must be obtained prior to any record release in order to collaborate their care with an outside provider. Patient/Guardian was advised if they have not already done so to contact the registration department to sign all necessary forms in order for [place] to release information regarding their care.   Consent: Patient/Guardian gives verbal consent for treatment and assignment of benefits for services provided during this visit.  Patient/Guardian expressed understanding and agreed to proceed.      Follow Up Instructions: Follow up in 3 months or sooner if needed with a new provider  Patient informed that I am leaving [facility] in 03/2023 and I relayed that they will be getting a new provider after that. Patient verbalized understanding and agreed with the plan.     I discussed the assessment and treatment plan with the patient. The patient was provided an opportunity to ask questions and all were answered. The patient agreed with the plan and demonstrated an understanding of the instructions.   The patient was advised to call back or seek an in-person evaluation if the symptoms worsen or if the condition fails to improve as anticipated.  I provided 8 minutes of non-face-to-face time during this encounter.   Oletta Darter, MD

## 2023-01-29 ENCOUNTER — Encounter (HOSPITAL_COMMUNITY): Payer: Self-pay

## 2023-04-23 ENCOUNTER — Encounter (HOSPITAL_COMMUNITY): Payer: Self-pay | Admitting: Psychiatry

## 2023-04-23 ENCOUNTER — Telehealth (HOSPITAL_COMMUNITY): Payer: MEDICAID | Admitting: Psychiatry

## 2023-04-23 VITALS — Wt 245.0 lb

## 2023-04-23 DIAGNOSIS — F251 Schizoaffective disorder, depressive type: Secondary | ICD-10-CM | POA: Diagnosis not present

## 2023-04-23 DIAGNOSIS — F419 Anxiety disorder, unspecified: Secondary | ICD-10-CM

## 2023-04-23 MED ORDER — ARIPIPRAZOLE 2 MG PO TABS: 4.0000 mg | ORAL_TABLET | Freq: Every day | ORAL | 0 refills | Status: DC

## 2023-04-23 MED ORDER — BUPROPION HCL ER (XL) 150 MG PO TB24: 150.0000 mg | ORAL_TABLET | Freq: Every day | ORAL | 0 refills | Status: DC

## 2023-04-23 NOTE — Progress Notes (Signed)
[facility] MD Virtual Progress Note   Patient Location: Home Provider Location: Home Office  I connect with patient by video and verified that I am speaking with correct person by using two identifiers. I discussed the limitations of evaluation and management by telemedicine and the availability of in person appointments. I also discussed with the patient that there may be a patient responsible charge related to this service. The patient expressed understanding and agreed to proceed.  Michael Vega [id number] 27 y.o.  04/23/2023 11:06 AM  History of Present Illness:  Patient is treated by video session.  He is a patient of Dr. Michae Kava who had left the practice.  He has been seen psychiatrist in our office since 2012.  He is taking Abilify 4 mg daily.  He reported symptoms are stable and denies any paranoia, hallucination, anger, irritability or any suicidal thoughts.  However he reported sexual side effects and wondering what are the options he can take.  He is taking summer classes and is studying sociology at [employer].  He is taking virtual classes.  So far her grades are good.  He admitted not had appointment for primary care since last year but like to schedule very soon.  He has no tremors, shakes or any EPS.  He lives by himself.  His mother and 2 sister live close by.  He denies drinking or using any illegal substances.  He is not working or using any illegal substances.  His appetite is okay.  He admitted some time anxiety and nervousness because of school and exams.  However he denies any panic attack.    Past Psychiatric History: Patient reported history of twice inpatient because of being bullied in the school and last inpatient because of stress.  In the past he had tried Zydis, BuSpar, Vistaril, trazodone and Wellbutrin.  History of psychosis and anxiety.  Denies any history of drug use.  Initially saw Dr. Lucianne Muss and then case transferred to Dr.  Michae Kava.   Outpatient Encounter Medications as of 04/23/2023  Medication Sig   ARIPiprazole (ABILIFY) 2 MG tablet Take 2 tablets (4 mg total) by mouth daily.   clobetasol (TEMOVATE) 0.05 % external solution Apply 1 Application topically 2 (two) times daily. (Patient not taking: Reported on 10/26/2022)   doxycycline (MONODOX) 50 MG capsule Take 2 capsules (100 mg total) by mouth 2 (two) times daily. (Patient not taking: Reported on 10/26/2022)   EPINEPHrine 0.3 mg/0.3 mL IJ SOAJ injection Inject 0.3 mg into the muscle as needed. (Patient not taking: Reported on 10/26/2022)   Fluticasone-Umeclidin-Vilant (TRELEGY ELLIPTA) 100-62.5-25 MCG/ACT AEPB Inhale 1 puff into the lungs in the morning and at bedtime.   loratadine (CLARITIN) 10 MG tablet Take 10 mg by mouth daily as needed for allergies. (Patient not taking: Reported on 10/26/2022)   tadalafil (CIALIS) 5 MG tablet Take 1 tablet (5 mg total) by mouth daily as needed for erectile dysfunction. (Patient not taking: Reported on 10/26/2022)   No facility-administered encounter medications on file as of 04/23/2023.    No results found for this or any previous visit (from the past 2160 hour(s)).   Psychiatric Specialty Exam: Physical Exam  Review of Systems  Weight 245 lb (111.1 kg).There is no height or weight on file to calculate BMI.  General Appearance: Casual  Eye Contact:  Fair  Speech:  Clear and Coherent and Slow  Volume:  Normal  Mood:  Euthymic  Affect:  Appropriate  Thought Process:  Goal Directed  Orientation:  Full (Time, Place, and Person)  Thought Content:  WDL  Suicidal Thoughts:  No  Homicidal Thoughts:  No  Memory:  Immediate;   Good Recent;   Good Remote;   Fair  Judgement:  Intact  Insight:  Present  Psychomotor Activity:  Normal  Concentration:  Concentration: Good and Attention Span: Good  Recall:  Good  Fund of Knowledge:  Good  Language:  Good  Akathisia:  No  Handed:  Right  AIMS (if indicated):     Assets:   Communication Skills Desire for Improvement Housing Resilience Social Support  ADL's:  Intact  Cognition:  WNL  Sleep:  ok     Assessment/Plan: Schizoaffective disorder, depressive type (HCC) - Plan: ARIPiprazole (ABILIFY) 2 MG tablet, buPROPion (WELLBUTRIN XL) 150 MG 24 hr tablet  Anxiety - Plan: buPROPion (WELLBUTRIN XL) 150 MG 24 hr tablet  I reviewed collaterals, history and current medication.  Patient complaining of sexual side effects with Abilify.  He recalled taking Wellbutrin but at that time was only taking Wellbutrin and stopped taking the Abilify and eventually did not do very well and go back to Abilify.  I recommend if he does not notice any side effects of Wellbutrin he can take to gather the sleep medicine to help the anxiety, and sexual side effects.  Also recommend to get blood work especially testosterone level.  Patient agreed with the plan.  We will start low-dose Wellbutrin 150 mg daily in the morning.  Continue Abilify 4 mg at bedtime.  Patient elected to cut down the dose of Abilify since he is concerned symptoms may come back.  Encourage walking, exercise.  Follow-up in 1 month.  Recommend to call us back if he feels worsening of symptoms.   Follow Up Instructions:     I discussed the assessment and treatment plan with the patient. The patient was provided an opportunity to ask questions and all were answered. The patient agreed with the plan and demonstrated an understanding of the instructions.   The patient was advised to call back or seek an in-person evaluation if the symptoms worsen or if the condition fails to improve as anticipated.    Collaboration of Care: Other provider involved in patient's care AEB notes are available in epic to review.  Patient/Guardian was advised Release of Information must be obtained prior to any record release in order to collaborate their care with an outside provider. Patient/Guardian was advised if they have not already  done so to contact the registration department to sign all necessary forms in order for [place] to release information regarding their care.   Consent: Patient/Guardian gives verbal consent for treatment and assignment of benefits for services provided during this visit. Patient/Guardian expressed understanding and agreed to proceed.     I provided 23 minutes of non face to face time during this encounter.  Note: This document was prepared by [employer] voice dictation technology and any errors that results from this process are unintentional.    Cleotis Nipper, MD 04/23/2023

## 2023-05-07 ENCOUNTER — Other Ambulatory Visit (HOSPITAL_COMMUNITY): Payer: Self-pay | Admitting: Psychiatry

## 2023-05-07 DIAGNOSIS — F419 Anxiety disorder, unspecified: Secondary | ICD-10-CM

## 2023-05-07 DIAGNOSIS — F251 Schizoaffective disorder, depressive type: Secondary | ICD-10-CM

## 2023-05-22 ENCOUNTER — Encounter (HOSPITAL_COMMUNITY): Payer: Self-pay

## 2023-05-24 ENCOUNTER — Telehealth (HOSPITAL_COMMUNITY): Payer: Medicaid Other | Admitting: Psychiatry

## 2023-05-30 ENCOUNTER — Telehealth (HOSPITAL_COMMUNITY): Payer: Medicaid Other | Admitting: Psychiatry

## 2023-07-03 ENCOUNTER — Encounter (HOSPITAL_COMMUNITY): Payer: Self-pay

## 2023-07-03 ENCOUNTER — Telehealth (HOSPITAL_COMMUNITY): Payer: Self-pay | Admitting: Psychiatry

## 2023-07-03 ENCOUNTER — Telehealth (HOSPITAL_BASED_OUTPATIENT_CLINIC_OR_DEPARTMENT_OTHER): Payer: Self-pay | Admitting: Psychiatry

## 2023-07-03 ENCOUNTER — Encounter (HOSPITAL_COMMUNITY): Payer: Self-pay | Admitting: Psychiatry

## 2023-07-03 DIAGNOSIS — Z91199 Patient's noncompliance with other medical treatment and regimen due to unspecified reason: Secondary | ICD-10-CM

## 2023-07-03 NOTE — Progress Notes (Signed)
No show

## 2023-07-03 NOTE — Telephone Encounter (Signed)
Per Dr. Lolly Mustache, patient has been dismissed due to missed appointments. Patient was mailed dismissal letter via certified mail on 07/03/2023.

## 2023-08-14 ENCOUNTER — Encounter (HOSPITAL_COMMUNITY): Payer: Self-pay

## 2023-08-14 ENCOUNTER — Other Ambulatory Visit (HOSPITAL_COMMUNITY): Payer: Self-pay

## 2023-08-14 ENCOUNTER — Telehealth (HOSPITAL_COMMUNITY): Payer: Self-pay

## 2023-08-14 DIAGNOSIS — F251 Schizoaffective disorder, depressive type: Secondary | ICD-10-CM

## 2023-08-14 DIAGNOSIS — F419 Anxiety disorder, unspecified: Secondary | ICD-10-CM

## 2023-08-14 MED ORDER — BUPROPION HCL ER (XL) 150 MG PO TB24: 150.0000 mg | ORAL_TABLET | Freq: Every day | ORAL | 0 refills | Status: DC

## 2023-08-14 MED ORDER — ARIPIPRAZOLE 2 MG PO TABS: 4.0000 mg | ORAL_TABLET | Freq: Every day | ORAL | 0 refills | Status: AC

## 2023-08-14 NOTE — Telephone Encounter (Signed)
Patient called the front desk and after doing some research we found that patient was cancelled twice because you had to reschedule. Patient only no-showed one appointment, he should not have been discharged. Patient has already found another provider but will need a one month supply of his medication to get him to his appointment. He takes Abilify and Wellbutrin. Can we provide him with a one month supply? Please review and advise, thank yoiu

## 2023-08-14 NOTE — Telephone Encounter (Signed)
I will send a 30-day prescription and if he like to continue with our office for medication management we can schedule him.  Not sure why he was discharged since he has only 1 no-show.

## 2023-08-31 ENCOUNTER — Other Ambulatory Visit (HOSPITAL_COMMUNITY): Payer: Self-pay | Admitting: Psychiatry

## 2023-08-31 DIAGNOSIS — F251 Schizoaffective disorder, depressive type: Secondary | ICD-10-CM

## 2023-09-08 ENCOUNTER — Other Ambulatory Visit (HOSPITAL_COMMUNITY): Payer: Self-pay | Admitting: Psychiatry

## 2023-09-08 DIAGNOSIS — F251 Schizoaffective disorder, depressive type: Secondary | ICD-10-CM

## 2023-11-13 ENCOUNTER — Encounter: Payer: Self-pay | Admitting: Family Medicine

## 2023-11-13 ENCOUNTER — Other Ambulatory Visit (HOSPITAL_COMMUNITY)
Admission: RE | Admit: 2023-11-13 | Discharge: 2023-11-13 | Disposition: A | Discharge status: Home / Self Care | Payer: MEDICAID | Source: Ambulatory Visit | Attending: Family Medicine | Admitting: Family Medicine

## 2023-11-13 ENCOUNTER — Ambulatory Visit: Payer: MEDICAID | Admitting: Family Medicine

## 2023-11-13 VITALS — BP 134/82 | HR 109 | Temp 98.4°F | Resp 14 | Ht 70.0 in | Wt 245.4 lb

## 2023-11-13 DIAGNOSIS — E66812 Obesity, class 2: Secondary | ICD-10-CM

## 2023-11-13 DIAGNOSIS — Z113 Encounter for screening for infections with a predominantly sexual mode of transmission: Secondary | ICD-10-CM | POA: Diagnosis present

## 2023-11-13 DIAGNOSIS — Z0000 Encounter for general adult medical examination without abnormal findings: Secondary | ICD-10-CM

## 2023-11-13 DIAGNOSIS — Z840 Family history of diseases of the skin and subcutaneous tissue: Secondary | ICD-10-CM

## 2023-11-13 DIAGNOSIS — F200 Paranoid schizophrenia: Secondary | ICD-10-CM

## 2023-11-13 DIAGNOSIS — L659 Nonscarring hair loss, unspecified: Secondary | ICD-10-CM

## 2023-11-13 DIAGNOSIS — R5383 Other fatigue: Secondary | ICD-10-CM

## 2023-11-13 DIAGNOSIS — Z0001 Encounter for general adult medical examination with abnormal findings: Secondary | ICD-10-CM | POA: Diagnosis not present

## 2023-11-13 DIAGNOSIS — Z833 Family history of diabetes mellitus: Secondary | ICD-10-CM

## 2023-11-13 DIAGNOSIS — R030 Elevated blood-pressure reading, without diagnosis of hypertension: Secondary | ICD-10-CM | POA: Insufficient documentation

## 2023-11-13 DIAGNOSIS — Z6835 Body mass index (BMI) 35.0-35.9, adult: Secondary | ICD-10-CM

## 2023-11-13 DIAGNOSIS — K219 Gastro-esophageal reflux disease without esophagitis: Secondary | ICD-10-CM | POA: Diagnosis not present

## 2023-11-13 DIAGNOSIS — L83 Acanthosis nigricans: Secondary | ICD-10-CM

## 2023-11-13 DIAGNOSIS — L708 Other acne: Secondary | ICD-10-CM

## 2023-11-13 MED ORDER — BENZOYL PEROXIDE WASH 5 % EX LIQD: Freq: Every day | CUTANEOUS | 12 refills | Status: AC

## 2023-11-13 MED ORDER — OMEPRAZOLE 40 MG PO CPDR: 40.0000 mg | DELAYED_RELEASE_CAPSULE | Freq: Every day | ORAL | 1 refills | Status: DC

## 2023-11-13 NOTE — Assessment & Plan Note (Signed)
Subacute on chronic Worse at night after laying down, gastric reflux, denies nausea, vomiting, difficulty swallowing, no chest pain Able to drink and eat still Recommend 8 week course of omeprazole Limit eating past 7pm - do not lay down for 2-3 hrs after eating Limit caffeine, acidic foods, triggering foods May use antacids

## 2023-11-13 NOTE — Assessment & Plan Note (Signed)
Appears chronic, stable Will check lipid direct, CMP, TSH, A1c Suggested balanced diet, pt has high level of animal protein.  Incorporate more veggies, fruits, and water as drink of choice Recommend exercise - 150 minutes per week

## 2023-11-13 NOTE — Assessment & Plan Note (Addendum)
Routine labs Concerns today: truncal acne, fam hx of lupus, and GERD Request STI testing - pt asymptomatic, no recent exposures  Things to do to keep yourself healthy  - Exercise at least 30-45 minutes a day, 3-4 days a week.  - Eat a low-fat diet with lots of fruits and vegetables, up to 7-9 servings per day.  - Seatbelts can save your life. Wear them always.  - Smoke detectors on every level of your home, check batteries every year.  - Eye Doctor - have an eye exam every 1-2 years  - Safe sex - if you may be exposed to STDs, use a condom.  - Alcohol -  If you drink, do it moderately, less than 2 drinks per day.  - Health Care Power of Attorney. Choose someone to speak for you if you are not able.  - Depression is common in our stressful world.If you're feeling down or losing interest in things you normally enjoy, please come in for a visit.  - Violence - If anyone is threatening or hurting you, please call immediately.

## 2023-11-13 NOTE — Progress Notes (Signed)
Complete physical exam  Patient: Michael Vega   DOB: [birth year]   28 y.o. Male  MRN: [id number]  Introduced to nurse practitioner role and practice setting.  All questions answered.  Discussed provider/patient relationship and expectations.   Subjective:    Chief Complaint  Patient presents with   Annual Exam    Auto immune disease, possible, lupus,  Dark Spots on skin arms and backs, labs needing requesting ANA, DNA, anti S, and CBC Had had acid reflux really bad late at night and discoloration on the back of neck     GEOFF DACANAY is a 28 y.o. male who presents today for a complete physical exam. He reports consuming a  breakfast foods, animal proteins  diet. The patient does not participate in regular exercise at present. He generally feels fairly well. He reports sleeping well. He does have additional problems to discuss today.   Concerns about acid reflux, family hx of lupus, and skin rashes.  He experiences significant acid reflux, particularly at night when lying down, though it can occur during the day. He has not been taking any medications for it recently but has tried Tums in the past without relief. He describes the sensation as heartburn. No vomiting or difficulty swallowing. Occasional coughing is attributed to the reflux feeling.  He has had skin rashes on his arms and back for at least eight months. The rashes sometimes itch, and he scratches them. He has not used any acne washes or seen a dermatologist for this issue.  He takes Abilify 4 mg daily for schizophrenia and reports a history of depression and anxiety. Symptoms appear to be well-managed.  He has a history of asthma but does not use an inhaler regularly anymore, only as needed. He was previously prescribed an EpiPen by a pulmonologist but has never experienced anaphylaxis and is unsure why it was prescribed. He was hospitalized in 2020 for respiratory issues, possibly pneumonia, but not for an allergic  reaction.  He reports a family history of lupus in his cousins and grandmother, and diabetes in his paternal grandmother. No current symptoms suggestive of lupus are present.  No alcohol, tobacco, or substance use. He is a Holiday representative in college, living independently, and finds it stressful. He experiences some fatigue but denies joint pain or breathing difficulties.  Most recent fall risk assessment:    05/18/2022   10:12 AM  Fall Risk   Falls in the past year? 0  Number falls in past yr: 0  Injury with Fall? 0  Risk for fall due to : No Fall Risks     Most recent depression screenings:    11/13/2023   10:26 AM 01/18/2023    4:51 PM  PHQ 2/9 Scores  PHQ - 2 Score 1   PHQ- 9 Score 2      Information is confidential and restricted. Go to Review Flowsheets to unlock data.     Vision:Within last year and Dental: No current dental problems and Last dental visit: 2023  Patient Active Problem List   Diagnosis Date Noted   Fatigue 11/13/2023   Class 2 obesity due to excess calories without serious comorbidity with body mass index (BMI) of 35.0 to 35.9 in adult 11/13/2023   Elevated blood pressure reading in office without diagnosis of hypertension 11/13/2023   Other acne 11/13/2023   Gastroesophageal reflux disease without esophagitis 11/13/2023   Acanthosis nigricans 05/18/2022   Mild persistent asthma without complication 05/18/2022   High risk medication  use 05/18/2022   Family history of lupus erythematosus 05/18/2022   Hair loss 05/18/2022   Chronic pruritic rash in adult 05/18/2022   Erectile dysfunction 05/18/2022   Screening for diabetes mellitus 05/18/2022   Depression, recurrent (HCC) 05/18/2022   Annual physical exam 05/18/2022   MDD (major depressive disorder), recurrent, in full remission (HCC) 03/09/2015   Paranoid schizophrenia (HCC) 08/04/2014   Past Medical History:  Diagnosis Date   Asthma    Depression    Schizophrenia (HCC)    Schizophrenia, paranoid  (HCC)    Seasonal allergies    Past Surgical History:  Procedure Laterality Date   WISDOM TOOTH EXTRACTION     Social History   Tobacco Use   Smoking status: Never   Smokeless tobacco: Never  Vaping Use   Vaping status: Never Used  Substance Use Topics   Alcohol use: No    Comment: 1-2 alcoholic drinks socially- maybe 1x/month or less   Drug use: No   No Known Allergies    Patient Care Team: Sallee Provencal, FNP as PCP - General (Family Medicine)   Outpatient Medications Prior to Visit  Medication Sig   ARIPiprazole (ABILIFY) 2 MG tablet Take 2 tablets (4 mg total) by mouth daily.   [DISCONTINUED] buPROPion (WELLBUTRIN XL) 150 MG 24 hr tablet Take 1 tablet (150 mg total) by mouth daily. (Patient not taking: Reported on 11/13/2023)   [DISCONTINUED] clobetasol (TEMOVATE) 0.05 % external solution Apply 1 Application topically 2 (two) times daily. (Patient not taking: Reported on 11/13/2023)   [DISCONTINUED] doxycycline (MONODOX) 50 MG capsule Take 2 capsules (100 mg total) by mouth 2 (two) times daily. (Patient not taking: Reported on 11/13/2023)   [DISCONTINUED] EPINEPHrine 0.3 mg/0.3 mL IJ SOAJ injection Inject 0.3 mg into the muscle as needed. (Patient not taking: Reported on 11/13/2023)   [DISCONTINUED] Fluticasone-Umeclidin-Vilant (TRELEGY ELLIPTA) 100-62.5-25 MCG/ACT AEPB Inhale 1 puff into the lungs in the morning and at bedtime. (Patient not taking: Reported on 11/13/2023)   [DISCONTINUED] loratadine (CLARITIN) 10 MG tablet Take 10 mg by mouth daily as needed for allergies. (Patient not taking: Reported on 10/26/2022)   [DISCONTINUED] tadalafil (CIALIS) 5 MG tablet Take 1 tablet (5 mg total) by mouth daily as needed for erectile dysfunction. (Patient not taking: Reported on 11/13/2023)   No facility-administered medications prior to visit.    Review of Systems  All other systems reviewed and are negative.     Objective:     BP 134/82   Pulse (!) 109   Temp 98.4 F (36.9  C)   Resp 14   Ht 5\' 10"  (1.778 m)   Wt 245 lb 6.4 oz (111.3 kg)   SpO2 98%   BMI 35.21 kg/m  BP Readings from Last 3 Encounters:  11/13/23 134/82  05/18/22 122/79  07/21/21 120/83   Wt Readings from Last 3 Encounters:  11/13/23 245 lb 6.4 oz (111.3 kg)  05/18/22 245 lb (111.1 kg)  09/08/20 254 lb 4.8 oz (115.3 kg)    Physical Exam Constitutional:      General: He is not in acute distress.    Appearance: Normal appearance. He is normal weight. He is not ill-appearing.  HENT:     Head: Normocephalic.     Right Ear: Hearing, tympanic membrane, ear canal and external ear normal. There is no impacted cerumen.     Left Ear: Hearing, tympanic membrane, ear canal and external ear normal. There is no impacted cerumen.     Nose: Nose normal.  Right Turbinates: Not enlarged.     Left Turbinates: Not enlarged.     Right Sinus: No maxillary sinus tenderness or frontal sinus tenderness.     Left Sinus: No maxillary sinus tenderness or frontal sinus tenderness.     Mouth/Throat:     Lips: Pink.     Mouth: Mucous membranes are moist.     Dentition: Normal dentition.     Tongue: No lesions. Tongue does not deviate from midline.     Pharynx: Oropharynx is clear. No oropharyngeal exudate or posterior oropharyngeal erythema.     Tonsils: No tonsillar exudate.  Eyes:     General: Lids are normal.     Extraocular Movements: Extraocular movements intact.     Right eye: Normal extraocular motion.     Left eye: Normal extraocular motion.     Conjunctiva/sclera: Conjunctivae normal.     Right eye: Right conjunctiva is not injected.     Left eye: Left conjunctiva is not injected.     Pupils: Pupils are equal, round, and reactive to light.     Comments: Glasses present  Neck:     Thyroid: No thyroid mass, thyromegaly or thyroid tenderness.     Vascular: No carotid bruit.  Cardiovascular:     Rate and Rhythm: Tachycardia present.     Pulses: Normal pulses.          Radial pulses are 2+  on the right side and 2+ on the left side.       Posterior tibial pulses are 2+ on the right side and 2+ on the left side.     Heart sounds: Normal heart sounds, S1 normal and S2 normal. No murmur heard.    No friction rub. No gallop.     Comments: Low ST, 100s Pulmonary:     Effort: Pulmonary effort is normal. No respiratory distress.     Breath sounds: Normal breath sounds. No stridor. No wheezing, rhonchi or rales.  Chest:     Comments: Gynecomastia present Abdominal:     General: Bowel sounds are normal. There is no distension.     Palpations: Abdomen is soft. There is no mass.     Tenderness: There is no abdominal tenderness. There is no right CVA tenderness, left CVA tenderness, guarding or rebound.     Hernia: No hernia is present.  Genitourinary:    Comments: Not assessed - no pt concerns Musculoskeletal:        General: No swelling or tenderness. Normal range of motion.     Cervical back: Normal range of motion. No rigidity.     Right lower leg: No edema.     Left lower leg: No edema.  Lymphadenopathy:     Cervical: No cervical adenopathy.     Right cervical: No superficial, deep or posterior cervical adenopathy.    Left cervical: No superficial, deep or posterior cervical adenopathy.     Upper Body:     Right upper body: No supraclavicular adenopathy.     Left upper body: No supraclavicular adenopathy.  Skin:    General: Skin is warm and dry.     Capillary Refill: Capillary refill takes less than 2 seconds.     Findings: Lesion present. No bruising or erythema.     Comments: Truncal acne present on chest, back and upper arms, non cystic  Neurological:     General: No focal deficit present.     Mental Status: He is alert and oriented to person, place, and time.  Mental status is at baseline.     GCS: GCS eye subscore is 4. GCS verbal subscore is 5. GCS motor subscore is 6.     Cranial Nerves: No cranial nerve deficit.     Sensory: No sensory deficit.     Motor: No  weakness, tremor or pronator drift.     Coordination: Romberg sign negative.     Gait: Gait is intact. Gait normal.  Psychiatric:        Attention and Perception: Attention and perception normal.        Mood and Affect: Mood and affect normal.        Speech: Speech normal.        Behavior: Behavior normal. Behavior is cooperative.        Thought Content: Thought content normal.        Cognition and Memory: Cognition and memory normal.        Judgment: Judgment normal.      No results found for any visits on 11/13/23.     Assessment & Plan:    Routine Health Maintenance and Physical Exam  Health Maintenance  Topic Date Due   COVID-19 Vaccine (1) Never done   Pneumococcal Vaccination (1 of 2 - PCV) Never done   HPV Vaccine (2 - Male 3-dose series) 10/09/2017   Flu Shot  04/26/2023   DTaP/Tdap/Td vaccine (7 - Td or Tdap) 09/12/2027   Hepatitis C Screening  Completed   HIV Screening  Completed    Discussed health benefits of physical activity, and encouraged him to engage in regular exercise appropriate for his age and condition.  Annual physical exam Assessment & Plan: Routine labs Concerns today: truncal acne, fam hx of lupus, and GERD Request STI testing - pt asymptomatic, no recent exposures  Things to do to keep yourself healthy  - Exercise at least 30-45 minutes a day, 3-4 days a week.  - Eat a low-fat diet with lots of fruits and vegetables, up to 7-9 servings per day.  - Seatbelts can save your life. Wear them always.  - Smoke detectors on every level of your home, check batteries every year.  - Eye Doctor - have an eye exam every 1-2 years  - Safe sex - if you may be exposed to STDs, use a condom.  - Alcohol -  If you drink, do it moderately, less than 2 drinks per day.  - Health Care Power of Attorney. Choose someone to speak for you if you are not able.  - Depression is common in our stressful world.If you're feeling down or losing interest in things you  normally enjoy, please come in for a visit.  - Violence - If anyone is threatening or hurting you, please call immediately.   Orders: -     CBC  Fatigue, unspecified type Assessment & Plan: Generalized Pt attributes to senior in college Recommend daily exercise and well balanced diet Hx of familial lupus Will check TSH, Vitamin B12, Vitamin D,CBC, ANA, CRP, and ESR  Orders: -     ANA -     C-reactive protein -     Sedimentation rate -     Vitamin B12 -     VITAMIN D 25 Hydroxy (Vit-D Deficiency, Fractures)  Gastroesophageal reflux disease without esophagitis Assessment & Plan: Subacute on chronic Worse at night after laying down, gastric reflux, denies nausea, vomiting, difficulty swallowing, no chest pain Able to drink and eat still Recommend 8 week course of omeprazole Limit eating  past 7pm - do not lay down for 2-3 hrs after eating Limit caffeine, acidic foods, triggering foods May use antacids    Orders: -     Omeprazole; Take 1 capsule (40 mg total) by mouth daily. Take daily for 8 weeks.  Dispense: 30 capsule; Refill: 1  Class 2 obesity due to excess calories without serious comorbidity with body mass index (BMI) of 35.0 to 35.9 in adult Assessment & Plan: Appears chronic, stable Will check lipid direct, CMP, TSH, A1c Suggested balanced diet, pt has high level of animal protein.  Incorporate more veggies, fruits, and water as drink of choice Recommend exercise - 150 minutes per week  Orders: -     Hemoglobin A1c -     Comprehensive metabolic panel -     LDL cholesterol, direct  Elevated blood pressure reading in office without diagnosis of hypertension Assessment & Plan: First check 148/91, with recheck 134/81 Suggest monitor at home with upper arm cuff x2 weekly Low sodium diet, exercise, weight loss Will check CMP    Other acne Assessment & Plan: Appears to have truncal acne, denies itching, states sometimes scabs bc pt picks at lesions Will  prescribe trial of benzoyl peroxide body wash for daily cleansing May need po abx Referral for derm for further eval  Orders: -     Ambulatory referral to Dermatology -     Benzoyl Peroxide Wash; Apply topically daily. 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 No skin lesions present at balding areas Refer to derm Tsh checked  Orders: -     TSH  Family history of lupus erythematosus Assessment & Plan: Chronic, rash, non-malar Will check ANA, ESR, and CRP Will hold on rheu referral - await labs Pt has generalized fatigue, but denies joint pain, DOB, facial rash  Orders: -     ANA -     C-reactive protein -     Sedimentation rate  Family history of diabetes mellitus -     Hemoglobin A1c  Screening examination for STI -     RPR -     HIV Antibody (routine testing w rflx) -     Cervicovaginal ancillary only  Acanthosis nigricans Assessment & Plan: Present on neck Will check A1C Appears to be chronic   Paranoid schizophrenia (HCC) Assessment & Plan: Chronic, stable Mixed with anxiety and depression Continue Daily Abilify 4mg  Continue to see Dr. Carrolyn Leigh    Return in about 3 months (around 02/10/2024) for BP Check.   I, Sallee Provencal, FNP, have reviewed all documentation for this visit. The documentation on 11/13/23 for the exam, diagnosis, procedures, and orders are all accurate and complete.   Sallee Provencal, FNP

## 2023-11-13 NOTE — Assessment & Plan Note (Signed)
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

## 2023-11-13 NOTE — Assessment & Plan Note (Signed)
Generalized Pt attributes to senior in college Recommend daily exercise and well balanced diet Hx of familial lupus Will check TSH, Vitamin B12, Vitamin D,CBC, ANA, CRP, and ESR

## 2023-11-13 NOTE — Assessment & Plan Note (Signed)
Present on neck Will check A1C Appears to be chronic

## 2023-11-13 NOTE — Assessment & Plan Note (Addendum)
Appears to have truncal acne, denies itching, states sometimes scabs bc pt picks at lesions Will prescribe trial of benzoyl peroxide body wash for daily cleansing May need po abx Referral for derm for further eval

## 2023-11-13 NOTE — Assessment & Plan Note (Signed)
First check 148/91, with recheck 134/81 Suggest monitor at home with upper arm cuff x2 weekly Low sodium diet, exercise, weight loss Will check CMP

## 2023-11-13 NOTE — Assessment & Plan Note (Signed)
Chronic, stable Mixed with anxiety and depression Continue Daily Abilify 4mg  Continue to see Dr. Carrolyn Leigh

## 2023-11-13 NOTE — Assessment & Plan Note (Signed)
Chronic, rash, non-malar Will check ANA, ESR, and CRP Will hold on rheu referral - await labs Pt has generalized fatigue, but denies joint pain, DOB, facial rash

## 2023-11-15 ENCOUNTER — Encounter: Payer: Self-pay | Admitting: Family Medicine

## 2023-11-16 LAB — CERVICOVAGINAL ANCILLARY ONLY
Chlamydia: NEGATIVE
Comment: NEGATIVE
Comment: NEGATIVE
Comment: NORMAL
Neisseria Gonorrhea: NEGATIVE
Trichomonas: NEGATIVE

## 2023-11-18 LAB — CBC
Hematocrit: 54 % — ABNORMAL HIGH (ref 37.5–51.0)
Hemoglobin: 18 g/dL — ABNORMAL HIGH (ref 13.0–17.7)
MCH: 28.8 pg (ref 26.6–33.0)
MCHC: 33.3 g/dL (ref 31.5–35.7)
MCV: 86 fL (ref 79–97)
Platelets: 299 10*3/uL (ref 150–450)
RBC: 6.25 x10E6/uL — ABNORMAL HIGH (ref 4.14–5.80)
RDW: 13.4 % (ref 11.6–15.4)
WBC: 4.8 10*3/uL (ref 3.4–10.8)

## 2023-11-18 LAB — COMPREHENSIVE METABOLIC PANEL
ALT: 27 [IU]/L (ref 0–44)
AST: 19 [IU]/L (ref 0–40)
Albumin: 4.9 g/dL (ref 4.3–5.2)
Alkaline Phosphatase: 71 [IU]/L (ref 44–121)
BUN/Creatinine Ratio: 10 (ref 9–20)
BUN: 12 mg/dL (ref 6–20)
Bilirubin Total: 0.5 mg/dL (ref 0.0–1.2)
CO2: 24 mmol/L (ref 20–29)
Calcium: 10 mg/dL (ref 8.7–10.2)
Chloride: 102 mmol/L (ref 96–106)
Creatinine, Ser: 1.19 mg/dL (ref 0.76–1.27)
Globulin, Total: 2.1 g/dL (ref 1.5–4.5)
Glucose: 88 mg/dL (ref 70–99)
Potassium: 4.3 mmol/L (ref 3.5–5.2)
Sodium: 141 mmol/L (ref 134–144)
Total Protein: 7 g/dL (ref 6.0–8.5)
eGFR: 85 mL/min/{1.73_m2} (ref >59)

## 2023-11-18 LAB — HEMOGLOBIN A1C
Est. average glucose Bld gHb Est-mCnc: 111 mg/dL
Hgb A1c MFr Bld: 5.5 % (ref 4.8–5.6)

## 2023-11-18 LAB — ANA: ANA Titer 1: NEGATIVE

## 2023-11-18 LAB — SEDIMENTATION RATE: Sed Rate: 2 mm/h (ref 0–15)

## 2023-11-18 LAB — VITAMIN B12: Vitamin B-12: 487 pg/mL (ref 232–1245)

## 2023-11-18 LAB — LDL CHOLESTEROL, DIRECT: LDL Direct: 139 mg/dL — ABNORMAL HIGH (ref 0–99)

## 2023-11-18 LAB — RPR: RPR Ser Ql: NONREACTIVE

## 2023-11-18 LAB — HIV ANTIBODY (ROUTINE TESTING W REFLEX): HIV Screen 4th Generation wRfx: NONREACTIVE

## 2023-11-18 LAB — C-REACTIVE PROTEIN: CRP: <1 mg/L (ref 0–10)

## 2023-11-18 LAB — TSH: TSH: 0.217 u[IU]/mL — ABNORMAL LOW (ref 0.450–4.500)

## 2023-11-18 LAB — VITAMIN D 25 HYDROXY (VIT D DEFICIENCY, FRACTURES): Vit D, 25-Hydroxy: 12.2 ng/mL — ABNORMAL LOW (ref 30.0–100.0)

## 2023-12-05 ENCOUNTER — Other Ambulatory Visit: Payer: Self-pay | Admitting: Family Medicine

## 2023-12-05 DIAGNOSIS — K219 Gastro-esophageal reflux disease without esophagitis: Secondary | ICD-10-CM

## 2024-02-21 ENCOUNTER — Ambulatory Visit: Payer: MEDICAID | Admitting: Family Medicine

## 2024-03-13 ENCOUNTER — Ambulatory Visit: Payer: MEDICAID | Admitting: Family Medicine

## 2024-04-03 ENCOUNTER — Encounter: Payer: Self-pay | Admitting: Family Medicine

## 2024-04-03 ENCOUNTER — Ambulatory Visit: Payer: MEDICAID | Admitting: Family Medicine

## 2024-04-03 VITALS — BP 125/83 | HR 98 | Temp 98.0°F | Ht 69.0 in | Wt 245.7 lb

## 2024-04-03 DIAGNOSIS — M2141 Flat foot [pes planus] (acquired), right foot: Secondary | ICD-10-CM

## 2024-04-03 DIAGNOSIS — L708 Other acne: Secondary | ICD-10-CM | POA: Diagnosis not present

## 2024-04-03 DIAGNOSIS — E6609 Other obesity due to excess calories: Secondary | ICD-10-CM

## 2024-04-03 DIAGNOSIS — J4530 Mild persistent asthma, uncomplicated: Secondary | ICD-10-CM

## 2024-04-03 DIAGNOSIS — Z6835 Body mass index (BMI) 35.0-35.9, adult: Secondary | ICD-10-CM

## 2024-04-03 DIAGNOSIS — K219 Gastro-esophageal reflux disease without esophagitis: Secondary | ICD-10-CM

## 2024-04-03 DIAGNOSIS — E66812 Obesity, class 2: Secondary | ICD-10-CM

## 2024-04-03 DIAGNOSIS — M2142 Flat foot [pes planus] (acquired), left foot: Secondary | ICD-10-CM

## 2024-04-03 MED ORDER — OMEPRAZOLE 40 MG PO CPDR: 40.0000 mg | DELAYED_RELEASE_CAPSULE | Freq: Every day | ORAL | 1 refills | Status: AC

## 2024-04-03 MED ORDER — BUDESONIDE-FORMOTEROL FUMARATE 160-4.5 MCG/ACT IN AERO: 2.0000 | INHALATION_SPRAY | Freq: Two times a day (BID) | RESPIRATORY_TRACT | 3 refills | Status: DC

## 2024-04-03 NOTE — Progress Notes (Signed)
 Established Patient Office Visit  Introduced to nurse practitioner role and practice setting.  All questions answered.  Discussed provider/patient relationship and expectations.   Subjective   Patient ID: Michael Vega, male    DOB: Feb 26, 1996  Age: 28 y.o. MRN: [id number] Chief Complaint  Patient presents with   Hypertension    Hypertension Patient is here for follow-up of elevated blood pressure. He is not exercising and is trying to be adherent to a low-salt diet. Has cut back on pork intake.  Reports that he does not monitor his blood pressure at home because he does not have one. Patient denies any symptoms.  States he does get out of breath when doing physical activity, reports he has been diagnosed with asthma.  Wanting a referral to a pulmonologist.   Follow-up   Discussed the use of AI scribe software for clinical note transcription with the patient, who gave verbal consent to proceed.  History of Present Illness Michael Vega is a 28 year old male who presents with concerns about blood pressure, asthma, and foot pain.  He does not currently monitor his blood pressure at home. He feels nervous about the readings during medical visits, which may contribute to the variability.  He has a history of asthma and was previously on a daily inhaler, which he used regularly until his symptoms improved. Currently, he experiences shortness of breath with physical activity, such as climbing stairs or walking to his car, but denies wheezing. He needs to 'take a minute to catch my breath again' after exertion. He has not been using an inhaler regularly, and his symptoms have been present for a while.  He experiences ongoing issues with gastric reflux. He previously took omeprazole  that helped alleviate symptoms, but symptoms returned upon discontinuation of the medication after 8 weeks of use.  He experiences foot pain due to flat feet, describing the pain as aching and sharp,  primarily from the base of the foot to the ankle. He has tried shoe inserts in the past without significant relief. His feet hurt even when not bearing weight, and he cannot stand for long periods.  He has acne on his back and arms, which persists despite previous attempts to address it. Derm referral got denied previously  He is a Holiday representative in college and has been under stress due to a heavy academic schedule and personal loss, including the passing of his mother last September. He tries to manage stress by watching shows and listening to music. He is attempting to become more active and improve his diet, noting efforts to cut portion sizes and avoid eating late at night. He has access to exercise facilities but struggles with consistency in using them.       04/03/2024    8:23 AM 11/13/2023   10:26 AM 01/18/2023    4:51 PM  Depression screen PHQ 2/9  Decreased Interest 0 1   Down, Depressed, Hopeless 1 0   PHQ - 2 Score 1 1   Altered sleeping 0 0   Tired, decreased energy 1 0   Change in appetite 0 1   Feeling bad or failure about yourself  0 0   Trouble concentrating 0 0   Moving slowly or fidgety/restless 0 0   Suicidal thoughts 0 0   PHQ-9 Score 2 2   Difficult doing work/chores Somewhat difficult Not difficult at all      Information is confidential and restricted. Go to Review Flowsheets to unlock  data.       04/03/2024    8:23 AM  GAD 7 : Generalized Anxiety Score  Nervous, Anxious, on Edge 1  Control/stop worrying 1  Worry too much - different things 1  Trouble relaxing 1  Restless 0  Easily annoyed or irritable 1  Afraid - awful might happen 0  Total GAD 7 Score 5  Anxiety Difficulty Somewhat difficult     Review of Systems  All other systems reviewed and are negative.   Negative unless indicated in HPI   Objective:     BP 125/83 (BP Location: Left Arm, Patient Position: Sitting, Cuff Size: Large)   Pulse 98   Temp 98 F (36.7 C) (Oral)   Ht 5' 9 (1.753  m)   Wt 245 lb 11.2 oz (111.4 kg)   SpO2 100%   BMI 36.28 kg/m    Physical Exam Constitutional:      General: He is awake. He is not in acute distress.    Appearance: Normal appearance. He is well-developed and well-groomed. He is obese. He is not ill-appearing, toxic-appearing or diaphoretic.  HENT:     Head: Normocephalic.     Nose: Nose normal.     Mouth/Throat:     Mouth: Mucous membranes are moist.  Eyes:     Extraocular Movements: Extraocular movements intact.     Conjunctiva/sclera: Conjunctivae normal.     Pupils: Pupils are equal, round, and reactive to light.  Cardiovascular:     Rate and Rhythm: Normal rate and regular rhythm.     Heart sounds: No murmur heard.    No friction rub. No gallop.  Pulmonary:     Effort: Pulmonary effort is normal. No tachypnea, bradypnea, accessory muscle usage, prolonged expiration or respiratory distress.     Breath sounds: Normal breath sounds and air entry. No stridor. No wheezing, rhonchi or rales.  Chest:     Chest wall: No tenderness.  Musculoskeletal:     Right lower leg: No edema.     Left lower leg: No edema.  Skin:    General: Skin is warm and dry.     Capillary Refill: Capillary refill takes less than 2 seconds.     Comments: Scatter acne on back, abdomen with scarring  Neurological:     General: No focal deficit present.     Mental Status: He is alert and oriented to person, place, and time. Mental status is at baseline.     GCS: GCS eye subscore is 4. GCS verbal subscore is 5. GCS motor subscore is 6.     Cranial Nerves: Cranial nerves 2-12 are intact. No cranial nerve deficit.     Sensory: No sensory deficit.     Motor: No weakness.     Coordination: Coordination normal.     Gait: Gait normal.  Psychiatric:        Attention and Perception: Attention and perception normal.        Mood and Affect: Mood and affect normal.        Speech: Speech normal.        Behavior: Behavior normal. Behavior is cooperative.         Thought Content: Thought content normal.        Cognition and Memory: Cognition and memory normal.        Judgment: Judgment normal.      No results found for any visits on 04/03/24.    The ASCVD Risk score (Arnett DK, et al., 2019) failed  to calculate for the following reasons:   The 2019 ASCVD risk score is only valid for ages 67 to 61    Assessment & Plan:  Flat feet, bilateral -     Ambulatory referral to Podiatry  Gastroesophageal reflux disease without esophagitis -     Omeprazole ; Take 1 capsule (40 mg total) by mouth daily. Take daily for 8 weeks.  Dispense: 90 capsule; Refill: 1  Other acne -     Ambulatory referral to Dermatology  Mild persistent asthma, unspecified whether complicated -     Budesonide -Formoterol  Fumarate; Inhale 2 puffs into the lungs 2 (two) times daily.  Dispense: 1 each; Refill: 3 -     Pulmonary Visit  Class 2 obesity due to excess calories without serious comorbidity with body mass index (BMI) of 35.0 to 35.9 in adult     Assessment and Plan Assessment & Plan Asthma Exertional dyspnea without wheezing. Improvement noted with consistent inhaler use- has not used, ran out of inhaler - used to see Pulm, [facility] - Order Symbicort  for daily maintenace. - Refer to pulmonology for further evaluation at [facility]. - Consider daily antihistamine if environmental allergies suspected.  Elevated Blood pressure Variable blood pressure readings. Elevated reading possibly due to anxiety. No home monitoring. GOAL<119/79 Today's with recheck 125/83 - Recommend purchasing an automatic upper arm blood pressure cuff, size large, for home monitoring. - Encourage lifestyle modifications, including increased physical activity and stress management. Well balanced diet. Weight Loss. - Follow up in six months for blood pressure monitoring.  Gastroesophageal Reflux Disease (GERD) Persistent symptoms improve with medication but recur upon  cessation of omeprazole . No gastroenterology evaluation yet. - Reorder omeprazole  40 mg daily for gastric reflux management. - Consider referral to gastroenterology if symptoms persist despite medication.  Flat feet with associated foot pain Aching and sharp foot pain from base to ankle. Shoe inserts ineffective. - Refer to podiatry for evaluation and potential orthotic management. - Recommend visiting a running store for gait analysis and appropriate shoe fitting.  Acne Persistent acne on back and arms. Previous dermatology referral denied. - Resubmit referral to dermatology for evaluation and management of acne. - Continue body wash daily, keep appropriate hygiene, use unscented soaps and lotions.   BMI = 36.28 Continue to make conscious decisions for well balanced diet smaller portions with increase protein, fruits, veggies, water as drink of choice, decrease starches, processed foods, and saturated fats. Increase weekly exercise - 150 minutes per week.   General Health Maintenance Managing stress and improving health through lifestyle changes. Plans regular exercise with his mother. - Encourage increased physical activity, such as walking, using a treadmill, or swimming. - Suggest using a fitness app to track daily steps and aim to increase step count. - Advise on dietary changes: reduce portion sizes, avoid eating late at night, focus on balanced intake of proteins, vegetables, and carbohydrates. - Discuss setting realistic and motivating health goals, such as weight loss or fitness milestones. - Discuss the benefits of exercise and healthy eating in managing stress and lowering blood pressure.    Return in about 6 months (around 10/04/2024) for BP monitoring and Weight.    Curtis DELENA Boom, FNP

## 2024-04-15 ENCOUNTER — Ambulatory Visit: Payer: MEDICAID | Admitting: Podiatry

## 2024-04-25 ENCOUNTER — Telehealth: Payer: Self-pay

## 2024-04-25 NOTE — Telephone Encounter (Signed)
 Noted

## 2024-04-25 NOTE — Telephone Encounter (Signed)
 Copied from CRM [id number]. Topic: Appointments - Appointment Info/Confirmation >> Apr 25, 2024 11:24 AM Montie POUR wrote: Patient/patient representative is calling for information regarding an appointment. Kellie with Dr. Alezkerov Office has tried to call Garrel 4 times and also, mailed a letter and no contact. She will close the referral. If he calls back, she will reopen the referral. Thanks

## 2024-05-06 ENCOUNTER — Encounter: Payer: Self-pay | Admitting: Podiatry

## 2024-05-06 ENCOUNTER — Ambulatory Visit (INDEPENDENT_AMBULATORY_CARE_PROVIDER_SITE_OTHER): Payer: MEDICAID

## 2024-05-06 ENCOUNTER — Ambulatory Visit (INDEPENDENT_AMBULATORY_CARE_PROVIDER_SITE_OTHER): Payer: MEDICAID | Admitting: Podiatry

## 2024-05-06 VITALS — Ht 69.0 in | Wt 245.7 lb

## 2024-05-06 DIAGNOSIS — Q6652 Congenital pes planus, left foot: Secondary | ICD-10-CM

## 2024-05-06 DIAGNOSIS — Q6651 Congenital pes planus, right foot: Secondary | ICD-10-CM | POA: Diagnosis not present

## 2024-05-06 DIAGNOSIS — M7751 Other enthesopathy of right foot: Secondary | ICD-10-CM | POA: Diagnosis not present

## 2024-05-06 DIAGNOSIS — M7752 Other enthesopathy of left foot: Secondary | ICD-10-CM

## 2024-05-06 NOTE — Progress Notes (Signed)
 Chief Complaint  Patient presents with   Flat Foot    Pt is here due to being flat foot, he states that he has a lot of pain in both feet due to this, states he can't stand for long periods of time without feet hurting him, has tried inserts in the past, didn't help much.    Subjective:  28 y.o. male presenting today as a new patient for evaluation of chronic bilateral foot pain ongoing for several years.  He states that he remembers even as a young child he would experience significant amount of pain and tenderness to the feet.  He has been to physicians in the past and has tried orthotics multiple times with no alleviation of his symptoms.  Unfortunately he has continued to have chronic pain and tenderness for several years with any prolonged activity and it is very debilitating on a daily basis.   Past Medical History:  Diagnosis Date   Anxiety    Asthma    Depression    GERD (gastroesophageal reflux disease)    Schizophrenia (HCC)    Schizophrenia, paranoid (HCC)    Seasonal allergies      RT foot 05/06/2024  LT foot 05/06/2024  Bilateral feet 05/06/2024   Objective/Physical Exam General: The patient is alert and oriented x3 in no acute distress.  Dermatology: Skin is warm, dry and supple bilateral lower extremities. Negative for open lesions or macerations.  Vascular: Palpable pedal pulses bilaterally. No edema or erythema noted. Capillary refill within normal limits.  Neurological: Grossly intact via light touch Musculoskeletal Exam: Range of motion within normal limits to all pedal and ankle joints bilateral. Muscle strength 5/5 in all groups bilateral. Upon weightbearing there is a medial longitudinal arch collapse bilaterally. Remove foot valgus noted to the bilateral lower extremities with excessive pronation upon mid stance.  Limited ankle joint dorsiflexion also noted bilateral.  There is also limited range of motion with inversion and eversion of the subtalar  joint  Radiographic Exam B/L feet 05/06/2024:  Normal osseous mineralization. Joint spaces preserved. No fracture/dislocation/boney destruction. Pes planus noted on radiographic exam lateral views. Decreased calcaneal inclination and metatarsal declination angle is noted. Medial talar head to deviation noted on AP radiograph.  There does appear to be degenerative changes noted specifically to the TN joint bilateral.  Assessment: 1. pes planus (flatfoot) bilateral 2.  Equinus bilateral lower extremities  Plan of Care:  -Patient evaluated.  X-rays reviewed -On x-ray there does appear to be some arthritic changes to the midtarsal joints specifically the talonavicular joint bilateral.  To better visualize the midtarsal and rear foot, CT would be beneficial and medically necessary to determine the extent of the arthritic change.  -CT scan ordered bilateral feet wo contrast -Unfortunately the patient has tried custom molded orthotics in the past multiple times which have not helped alleviate any of his symptoms.  He has chronic severe pain on a daily basis with any prolonged activity because of his flatfoot deformity.  I do believe that surgery is likely recommended however we will wait to see the results from the CT -Return to clinic about 1 week after CT scan results are available to review and discuss further treatment options  *Currently not working.  Enrolled in online school at [employer]  Thresa EMERSON Sar, DPM [facility]  Dr. Thresa EMERSON Sar, DPM    [address] [place] [address]  [place], [place] [address]                Office [phone]  Fax [phone]

## 2024-05-09 ENCOUNTER — Other Ambulatory Visit: Payer: MEDICAID

## 2024-05-09 ENCOUNTER — Inpatient Hospital Stay: Admission: RE | Admit: 2024-05-09 | Payer: MEDICAID | Source: Ambulatory Visit

## 2024-05-21 ENCOUNTER — Telehealth: Payer: Self-pay | Admitting: Podiatry

## 2024-05-21 NOTE — Telephone Encounter (Signed)
 Patient called stati ng he was suppose to have CT done but he needs auth from insurance. Patient states he talked with his insurance. Please follow up with pt in reference to CT.

## 2024-05-27 ENCOUNTER — Encounter: Payer: Self-pay | Admitting: Podiatry

## 2024-06-02 ENCOUNTER — Ambulatory Visit
Admission: RE | Admit: 2024-06-02 | Discharge: 2024-06-02 | Disposition: A | Discharge status: Home / Self Care | Payer: MEDICAID | Source: Ambulatory Visit | Attending: Podiatry | Admitting: Podiatry

## 2024-06-02 ENCOUNTER — Ambulatory Visit
Admission: RE | Admit: 2024-06-02 | Discharge: 2024-06-02 | Disposition: A | Discharge status: Home / Self Care | Payer: MEDICAID | Source: Ambulatory Visit | Attending: Podiatry

## 2024-06-02 DIAGNOSIS — Q6652 Congenital pes planus, left foot: Secondary | ICD-10-CM

## 2024-06-18 ENCOUNTER — Telehealth: Payer: Self-pay | Admitting: Podiatry

## 2024-06-18 NOTE — Telephone Encounter (Signed)
 Called patient. He was in the middle of something important and will call back to schedule the appointment for:   This patient needs a f/u appt with me. He had his CT scans done his foot and needs to f/u to review. Dr. Janit

## 2024-07-01 ENCOUNTER — Encounter: Payer: Self-pay | Admitting: Podiatry

## 2024-07-01 ENCOUNTER — Ambulatory Visit (INDEPENDENT_AMBULATORY_CARE_PROVIDER_SITE_OTHER): Payer: MEDICAID | Admitting: Podiatry

## 2024-07-01 VITALS — Ht 69.0 in | Wt 245.7 lb

## 2024-07-01 DIAGNOSIS — M216X1 Other acquired deformities of right foot: Secondary | ICD-10-CM

## 2024-07-01 DIAGNOSIS — Q6652 Congenital pes planus, left foot: Secondary | ICD-10-CM | POA: Diagnosis not present

## 2024-07-01 DIAGNOSIS — Q6689 Other  specified congenital deformities of feet: Secondary | ICD-10-CM | POA: Diagnosis not present

## 2024-07-01 DIAGNOSIS — M216X2 Other acquired deformities of left foot: Secondary | ICD-10-CM

## 2024-07-01 DIAGNOSIS — Q6651 Congenital pes planus, right foot: Secondary | ICD-10-CM | POA: Diagnosis not present

## 2024-07-01 NOTE — Progress Notes (Signed)
 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   Subjective:  28 y.o. male presenting today for follow-up evaluation of chronic bilateral foot pain ongoing for several years.  Last visit CT was ordered bilateral feet.  Presenting today to review the results and discuss further treatment options   Brief history: He states that he remembers even as a young child he would experience significant amount of pain and tenderness to the feet.  He has been to physicians in the past and has tried orthotics multiple times with no alleviation of his symptoms.  Unfortunately he has continued to have chronic pain and tenderness for several years with any prolonged activity and it is very debilitating on a daily basis.  Past Medical History:  Diagnosis Date   Anxiety    Asthma    Depression    GERD (gastroesophageal reflux disease)    Schizophrenia (HCC)    Schizophrenia, paranoid (HCC)    Seasonal allergies      RT foot 05/06/2024  LT foot 05/06/2024  Bilateral feet 05/06/2024   Objective/Physical Exam General: The patient is alert and oriented x3 in no acute distress.  Dermatology: Skin is warm, dry and supple bilateral lower extremities. Negative for open lesions or macerations.  Vascular: Palpable pedal pulses bilaterally. No edema or erythema noted. Capillary refill within normal limits.  Neurological: Grossly intact via light touch  Musculoskeletal Exam: Limited range of motion noted to the subtalar joints bilateral consistent with tarsal coalition. limited range of motion with inversion and eversion of the subtalar joint.  Upon weightbearing there is a medial longitudinal arch collapse bilaterally. Rearfoot valgus noted to the bilateral lower extremities with excessive pronation upon mid stance.  Limited ankle joint dorsiflexion also noted bilateral  consistent with a gastrocnemius equinus.   Radiographic Exam B/L feet 05/06/2024:  Normal osseous mineralization. Joint spaces preserved. No fracture/dislocation/boney destruction. Pes planus noted on radiographic exam lateral views. Decreased calcaneal inclination and metatarsal declination angle is noted. Medial talar head to deviation noted on AP radiograph.  There does appear to be degenerative changes noted specifically to the TN joint bilateral.  CT FOOT LEFT WO CONTRAST 06/02/2024 IMPRESSION: 1. 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 Associated pes planus. 2. 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 3. No acute osseous abnormality. 4. Mild degenerative arthropathy at the first metatarsal-medial hallux sesamoid articulation.  CT FOOT RIGHT WO CONTRAST 06/02/2024 IMPRESSION: 1. 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 Associated pes planus. 2. No acute osseous abnormality. 3. Mild degenerative arthropathy at first metatarsal-medial hallux sesamoid articulation. 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  Assessment: 1. pes planus (flatfoot) bilateral 2.  Equinus bilateral lower extremities 3.  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  CT scans reviewed today in detail -Unfortunately patient continues to have pain and tenderness on a daily basis.  This has been ongoing for several years despite conservative care including orthotics and shoe gear modifications.  This is affecting his daily quality of life and he is very frustrated due to the pain that he experiences on a daily basis.  I do believe that surgery is warranted at this time.  The surgery would consist of resection of  the tarsal coalition with arthrodesis of the subtalar joint, tendo Achilles  lengthening, and cotton medial cuneiform opening wedge osteotomy in an attempt to restore the medial longitudinal arch of the foot and create stability of the foot with weightbearing and walking.  The surgery was discussed in detail and length with the patient and his mother who was present as well today.  Risk benefits advantages and disadvantages of the procedure were explained in length in detail.  No guarantees were expressed or implied.  The postoperative recovery course was also explained in length in detail.  He understands that he will be strictly nonweightbearing at minimum 8 weeks postoperatively.  After a long lengthy discussion with the patient and his mother they are both in agreement to proceed with surgery at this time -Authorization for surgery was initiated today.  Surgery will consist of resection of tarsal coalition right.  Subtalar arthrodesis right.  Cotton medial cuneiform opening wedge osteotomy right.  Tendo Achilles lengthening right. Return to clinic 1 week postop  *Currently not working.  Enrolled in online school at [employer] *Mother's name is Marval Thresa EMERSON Janit, DPM [facility]  Dr. Thresa EMERSON Janit, DPM    [address]                                        [place], [place] [address]                Office [phone]  Fax [phone]

## 2024-07-15 ENCOUNTER — Telehealth: Payer: Self-pay | Admitting: Podiatry

## 2024-07-15 NOTE — Telephone Encounter (Signed)
 DOS- 08/14/2024  TENDO-ACHILLES LENGTH RT- 72314 COTTON TARSAL OSTEOTOMY RT- 28304 SUBTALAR ARTHRODESIS RT- 28725 OSTECTOMY, EXCISION OF TARSAL COALITION RT- [phone]  TRILLIUM EFFECTIVE DATE- 03/26/2023  PER TRILLIUM PORTAL, NO PRIOR AUTH IS REQUIRED FOR CPT CODES [id number], [id number], [id number], AND [phone].

## 2024-07-31 ENCOUNTER — Telehealth: Payer: Self-pay | Admitting: Podiatry

## 2024-07-31 NOTE — Telephone Encounter (Signed)
 Called patient as I received email from [place] at [facility] stating that surgery will need to be moved out due to cost of implant. Called patient and he wanting to see what other alternatives to surgery he has. Please advise, thanks

## 2024-08-07 NOTE — Telephone Encounter (Signed)
 Spoke with Dr.Evans in regards to patients case. He has ok'd surgery to be moved to [facility], I have advised patient I will review schedule and contact him back .

## 2024-08-19 ENCOUNTER — Encounter: Payer: Self-pay | Admitting: Podiatry

## 2024-08-19 ENCOUNTER — Ambulatory Visit (INDEPENDENT_AMBULATORY_CARE_PROVIDER_SITE_OTHER): Payer: MEDICAID | Admitting: Podiatry

## 2024-08-19 VITALS — Ht 69.0 in | Wt 245.7 lb

## 2024-08-19 DIAGNOSIS — Q6652 Congenital pes planus, left foot: Secondary | ICD-10-CM

## 2024-08-19 DIAGNOSIS — M25571 Pain in right ankle and joints of right foot: Secondary | ICD-10-CM | POA: Diagnosis not present

## 2024-08-19 DIAGNOSIS — Q6651 Congenital pes planus, right foot: Secondary | ICD-10-CM | POA: Diagnosis not present

## 2024-08-19 DIAGNOSIS — M25572 Pain in left ankle and joints of left foot: Secondary | ICD-10-CM | POA: Diagnosis not present

## 2024-08-19 MED ORDER — BETAMETHASONE SOD PHOS & ACET 6 (3-3) MG/ML IJ SUSP
3.0000 mg | Freq: Once | INTRAMUSCULAR | Status: AC
  Administered 2024-08-19: 3 mg via INTRA_ARTICULAR

## 2024-08-19 MED ORDER — MELOXICAM 15 MG PO TABS: 15.0000 mg | ORAL_TABLET | Freq: Every day | ORAL | 1 refills | Status: AC

## 2024-08-19 NOTE — Progress Notes (Signed)
 Chief Complaint  Patient presents with   Injections    Pt is here for injections into both feet for pain.   Subjective:  28 y.o. male presenting today for follow-up evaluation of pes planus tarsal coalition bilateral feet.  Patient states that surgery has been postponed due to insurance purposes   Brief history: chronic bilateral foot pain ongoing for several years. He states that he remembers even as a young child he would experience significant amount of pain and tenderness to the feet.  He has been to physicians in the past and has tried orthotics multiple times with no alleviation of his symptoms.  Unfortunately he has continued to have chronic pain and tenderness for several years with any prolonged activity and it is very debilitating on a daily basis.  Past Medical History:  Diagnosis Date   Anxiety    Asthma    Depression    GERD (gastroesophageal reflux disease)    Schizophrenia (HCC)    Schizophrenia, paranoid (HCC)    Seasonal allergies      RT foot 05/06/2024  LT foot 05/06/2024  Bilateral feet 05/06/2024   Objective/Physical Exam General: The patient is alert and oriented x3 in no acute distress.  Dermatology: Skin is warm, dry and supple bilateral lower extremities. Negative for open lesions or macerations.  Vascular: Palpable pedal pulses bilaterally. No edema or erythema noted. Capillary refill within normal limits.  Neurological: Grossly intact via light touch  Musculoskeletal Exam: Unchanged.  Limited range of motion noted to the subtalar joints bilateral consistent with tarsal coalition. limited range of motion with inversion and eversion of the subtalar joint.  Upon weightbearing there is a medial longitudinal arch collapse bilaterally. Rearfoot valgus noted to the bilateral lower extremities with excessive pronation upon mid stance.  Limited ankle joint dorsiflexion also noted bilateral consistent with a gastrocnemius equinus.   Radiographic Exam B/L feet  05/06/2024:  Normal osseous mineralization. Joint spaces preserved. No fracture/dislocation/boney destruction. Pes planus noted on radiographic exam lateral views. Decreased calcaneal inclination and metatarsal declination angle is noted. Medial talar head to deviation noted on AP radiograph.  There does appear to be degenerative changes noted specifically to the TN joint bilateral.  CT FOOT LEFT WO CONTRAST 06/02/2024 IMPRESSION: 1. 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 Associated pes planus. 2. 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 3. No acute osseous abnormality. 4. Mild degenerative arthropathy at the first metatarsal-medial hallux sesamoid articulation.  CT FOOT RIGHT WO CONTRAST 06/02/2024 IMPRESSION: 1. 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 Associated pes planus. 2. No acute osseous abnormality. 3. Mild degenerative arthropathy at first metatarsal-medial hallux sesamoid articulation. 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  Assessment: 1. pes planus (flatfoot) bilateral 2.  Equinus bilateral lower extremities 3.  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  -Injection of 0.5 cc Celestone  Soluspan injected into the sinus tarsi bilateral -Prescription for meloxicam  15 mg daily -Will plan to reach out to our surgery coordinator to get an update as far as scheduling his surgery -Authorization for surgery was initiated last visit 07/01/2024.  Surgery will consist of resection of tarsal coalition right.  Subtalar arthrodesis right.  Cotton medial cuneiform opening wedge osteotomy right.  Tendo Achilles lengthening right. -Return to clinic 1 week postop  *Currently not working.  Enrolled in online school at [employer] *Mother's name  is  Marval Thresa EMERSON Janit, DPM [facility]  Dr. Thresa EMERSON Janit, DPM    [address] [place], [place] [address]                Office [phone]  Fax [phone]

## 2024-08-20 ENCOUNTER — Encounter: Payer: MEDICAID | Admitting: Podiatry

## 2024-08-29 ENCOUNTER — Encounter: Payer: MEDICAID | Admitting: Podiatry

## 2024-09-08 NOTE — Telephone Encounter (Signed)
 Patient called me back and is wanting to see how the injections work before scheduling surgery. He states he is unsure of proceeding with surgery at the moment. Scheduled patient appointment with provider per his request.

## 2024-09-08 NOTE — Progress Notes (Unsigned)
°  ° ° °  Acute visit   Patient: Michael Vega   DOB: [birth year]   28 y.o. Male  MRN: [id number] PCP: Wellington Curtis LABOR, FNP (Inactive)   No chief complaint on file.  Subjective    Discussed the use of AI scribe software for clinical note transcription with the patient, who gave verbal consent to proceed.  History of Present Illness     Review of systems as noted in HPI.   Objective    There were no vitals taken for this visit. Physical Exam    No results found for any visits on 09/09/24.  Assessment & Plan     Problem List Items Addressed This Visit   None   Assessment and Plan Assessment & Plan      No orders of the defined types were placed in this encounter.    No follow-ups on file.      Isaiah LABOR Pepper, MD  [facility] [phone] (phone) [phone] (fax)

## 2024-09-08 NOTE — Telephone Encounter (Signed)
 Patient called and left message in regards to surgery. Called back and left message for patient to call me.

## 2024-09-09 ENCOUNTER — Ambulatory Visit: Payer: MEDICAID

## 2024-09-09 VITALS — BP 131/97 | HR 94 | Temp 98.3°F | Ht 69.0 in | Wt 254.0 lb

## 2024-09-09 DIAGNOSIS — J4540 Moderate persistent asthma, uncomplicated: Secondary | ICD-10-CM | POA: Insufficient documentation

## 2024-09-09 DIAGNOSIS — G43009 Migraine without aura, not intractable, without status migrainosus: Secondary | ICD-10-CM | POA: Insufficient documentation

## 2024-09-09 DIAGNOSIS — E6609 Other obesity due to excess calories: Secondary | ICD-10-CM

## 2024-09-09 DIAGNOSIS — R030 Elevated blood-pressure reading, without diagnosis of hypertension: Secondary | ICD-10-CM

## 2024-09-09 DIAGNOSIS — E66812 Obesity, class 2: Secondary | ICD-10-CM

## 2024-09-09 DIAGNOSIS — Z6835 Body mass index (BMI) 35.0-35.9, adult: Secondary | ICD-10-CM

## 2024-09-09 MED ORDER — BUDESONIDE-FORMOTEROL FUMARATE 160-4.5 MCG/ACT IN AERO: 2.0000 | INHALATION_SPRAY | Freq: Two times a day (BID) | RESPIRATORY_TRACT | 3 refills | Status: AC

## 2024-09-09 MED ORDER — PROPRANOLOL HCL ER 60 MG PO CP24: 60.0000 mg | ORAL_CAPSULE | Freq: Every day | ORAL | 3 refills | Status: AC

## 2024-09-12 ENCOUNTER — Encounter: Payer: MEDICAID | Admitting: Podiatry

## 2024-09-15 ENCOUNTER — Telehealth: Payer: Self-pay | Admitting: Podiatry

## 2024-09-15 NOTE — Telephone Encounter (Signed)
 cld pt bk from mess lft about FMLA/disab forms. I adv forms can be faxed direct to me and he would have to pay 25 fee for them to be completed.

## 2024-09-26 ENCOUNTER — Ambulatory Visit (INDEPENDENT_AMBULATORY_CARE_PROVIDER_SITE_OTHER): Payer: MEDICAID | Admitting: Podiatry

## 2024-09-26 DIAGNOSIS — M19072 Primary osteoarthritis, left ankle and foot: Secondary | ICD-10-CM | POA: Diagnosis not present

## 2024-09-26 DIAGNOSIS — M19071 Primary osteoarthritis, right ankle and foot: Secondary | ICD-10-CM | POA: Diagnosis not present

## 2024-09-26 MED ORDER — BETAMETHASONE SOD PHOS & ACET 6 (3-3) MG/ML IJ SUSP
3.0000 mg | Freq: Once | INTRAMUSCULAR | Status: AC
  Administered 2024-09-26: 3 mg via INTRA_ARTICULAR

## 2024-09-26 NOTE — Progress Notes (Signed)
 "  Chief Complaint  Patient presents with   Foot Pain    B/L injections for pes planus (flatfoot)     Subjective:  29 y.o. male presenting today for follow-up evaluation of pes planus tarsal coalition bilateral feet.  Patient states that surgery has been postponed due to insurance purposes.  Patient states the injections help temporarily.   Brief history: chronic bilateral foot pain ongoing for several years. He states that he remembers even as a young child he would experience significant amount of pain and tenderness to the feet.  He has been to physicians in the past and has tried orthotics multiple times with no alleviation of his symptoms.  Unfortunately he has continued to have chronic pain and tenderness for several years with any prolonged activity and it is very debilitating on a daily basis.  Past Medical History:  Diagnosis Date   Anxiety    Asthma    Depression    GERD (gastroesophageal reflux disease)    Schizophrenia (HCC)    Schizophrenia, paranoid (HCC)    Seasonal allergies      RT foot 05/06/2024  LT foot 05/06/2024  Bilateral feet 05/06/2024   Objective/Physical Exam General: The patient is alert and oriented x3 in no acute distress.  Dermatology: Skin is warm, dry and supple bilateral lower extremities. Negative for open lesions or macerations.  Vascular: Palpable pedal pulses bilaterally. No edema or erythema noted. Capillary refill within normal limits.  Neurological: Grossly intact via light touch  Musculoskeletal Exam: Unchanged.  Rigid range of motion noted to the subtalar joints bilateral consistent with tarsal coalition. limited range of motion with inversion and eversion of the subtalar joint.  Upon weightbearing there is a medial longitudinal arch collapse bilaterally. Rearfoot valgus noted to the bilateral lower extremities with excessive pronation upon mid stance.  Limited ankle joint dorsiflexion also noted bilateral consistent with a gastrocnemius  equinus.   Radiographic Exam B/L feet 05/06/2024:  Normal osseous mineralization. Joint spaces preserved. No fracture/dislocation/boney destruction. Pes planus noted on radiographic exam lateral views. Decreased calcaneal inclination and metatarsal declination angle is noted. Medial talar head to deviation noted on AP radiograph.  There does appear to be degenerative changes noted specifically to the TN joint bilateral.  CT FOOT LEFT WO CONTRAST 06/02/2024 IMPRESSION: 1. 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 Associated pes planus. 2. 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 3. No acute osseous abnormality. 4. Mild degenerative arthropathy at the first metatarsal-medial hallux sesamoid articulation.  CT FOOT RIGHT WO CONTRAST 06/02/2024 IMPRESSION: 1. 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 Associated pes planus. 2. No acute osseous abnormality. 3. Mild degenerative arthropathy at first metatarsal-medial hallux sesamoid articulation. 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  Assessment: 1. pes planus (flatfoot) bilateral 2.  Equinus bilateral lower extremities 3.  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  -Injection of 0.5 cc Celestone  Soluspan injected into the sinus tarsi bilateral - Continue meloxicam  15 mg daily.  Refills provided as needed - Still is trying to figure out the details of surgery and when he would like to pursue surgery -Authorization for surgery was initiated on 07/01/2024.  Surgery would consist of resection of tarsal coalition right.  Subtalar arthrodesis right.  Cotton medial cuneiform opening wedge osteotomy right.  Tendo Achilles lengthening right. -Return to clinic PRN  *Currently not working.  Enrolled in online school at  [employer] *Mother's name is Marval Thresa EMERSON Janit, DPM [facility]  Dr. Thresa EMERSON Janit, DPM    [address] [place], [place] [address]                Office [phone]  Fax [phone]     "

## 2024-10-14 ENCOUNTER — Ambulatory Visit: Payer: MEDICAID

## 2024-10-14 ENCOUNTER — Ambulatory Visit: Payer: MEDICAID | Admitting: Nurse Practitioner

## 2024-10-14 ENCOUNTER — Ambulatory Visit: Payer: MEDICAID | Admitting: Family Medicine

## 2024-10-15 ENCOUNTER — Encounter: Payer: Self-pay | Admitting: Family Medicine

## 2024-10-15 ENCOUNTER — Ambulatory Visit: Payer: MEDICAID | Admitting: Family Medicine

## 2024-10-15 VITALS — BP 112/80 | HR 78 | Temp 98.2°F | Ht 69.0 in | Wt 256.0 lb

## 2024-10-15 DIAGNOSIS — L918 Other hypertrophic disorders of the skin: Secondary | ICD-10-CM

## 2024-10-15 NOTE — Progress Notes (Signed)
 "   Patient ID: Michael Vega, male    DOB: May 18, 1996, 29 y.o.   MRN: [id number]  This visit was conducted in person.  There were no vitals taken for this visit.   CC:  Chief Complaint  Patient presents with   Medical Management of Chronic Issues    Subjective:   HPI: Michael Vega is a 29 y.o. male presenting on 10/15/2024 for Medical Management of Chronic Issues  PCP: Isaiah Pepper  Pt reports skin tag scratch off this AM.   Appt cancelled.. copay returned by front desk.      Relevant past medical, surgical, family and social history reviewed and updated as indicated. Interim medical history since our last visit reviewed. Allergies and medications reviewed and updated. Outpatient Medications Prior to Visit  Medication Sig Dispense Refill   ARIPiprazole  (ABILIFY ) 2 MG tablet Take 2 tablets (4 mg total) by mouth daily. 60 tablet 0   benzoyl peroxide  5 % external liquid Apply topically daily. Daily wash in shower for truncal acne 142 g 12   budesonide -formoterol  (SYMBICORT ) 160-4.5 MCG/ACT inhaler Inhale 2 puffs into the lungs 2 (two) times daily. 1 each 3   meloxicam  (MOBIC ) 15 MG tablet Take 1 tablet (15 mg total) by mouth daily. 60 tablet 1   meloxicam  (MOBIC ) 15 MG tablet Take 15 mg by mouth daily.     omeprazole  (PRILOSEC) 40 MG capsule Take 1 capsule (40 mg total) by mouth daily. Take daily for 8 weeks. 90 capsule 1   propranolol  ER (INDERAL  LA) 60 MG 24 hr capsule Take 1 capsule (60 mg total) by mouth daily. 30 capsule 3   No facility-administered medications prior to visit.     Per HPI unless specifically indicated in ROS section below Review of Systems Objective:  There were no vitals taken for this visit.  Wt Readings from Last 3 Encounters:  09/09/24 254 lb (115.2 kg)  08/19/24 245 lb 11.2 oz (111.4 kg)  07/01/24 245 lb 11.2 oz (111.4 kg)      Physical Exam    Results for orders placed or performed in visit on 11/13/23  Cervicovaginal ancillary  only   Collection Time: 11/13/23 11:04 AM  Result Value Ref Range   Neisseria Gonorrhea Negative    Chlamydia Negative    Trichomonas Negative    Comment Normal Reference Range Trichomonas - Negative    Comment Normal Reference Ranger Chlamydia - Negative    Comment      Normal Reference Range Neisseria Gonorrhea - Negative  CBC   Collection Time: 11/13/23 11:13 AM  Result Value Ref Range   WBC 4.8 3.4 - 10.8 x10E3/uL   RBC 6.25 (H) 4.14 - 5.80 x10E6/uL   Hemoglobin 18.0 (H) 13.0 - 17.7 g/dL   Hematocrit 45.9 (H) 62.4 - 51.0 %   MCV 86 79 - 97 fL   MCH 28.8 26.6 - 33.0 pg   MCHC 33.3 31.5 - 35.7 g/dL   RDW 86.5 88.3 - 84.5 %   Platelets 299 150 - 450 x10E3/uL  Hemoglobin A1c   Collection Time: 11/13/23 11:13 AM  Result Value Ref Range   Hgb A1c MFr Bld 5.5 4.8 - 5.6 %   Est. average glucose Bld gHb Est-mCnc 111 mg/dL  TSH   Collection Time: 11/13/23 11:13 AM  Result Value Ref Range   TSH 0.217 (L) 0.450 - 4.500 uIU/mL  Comprehensive metabolic panel   Collection Time: 11/13/23 11:13 AM  Result Value Ref Range   Glucose  88 70 - 99 mg/dL   BUN 12 6 - 20 mg/dL   Creatinine, Ser 8.80 0.76 - 1.27 mg/dL   eGFR 85 >40 fO/fpw/8.26   BUN/Creatinine Ratio 10 9 - 20   Sodium 141 134 - 144 mmol/L   Potassium 4.3 3.5 - 5.2 mmol/L   Chloride 102 96 - 106 mmol/L   CO2 24 20 - 29 mmol/L   Calcium 10.0 8.7 - 10.2 mg/dL   Total Protein 7.0 6.0 - 8.5 g/dL   Albumin 4.9 4.3 - 5.2 g/dL   Globulin, Total 2.1 1.5 - 4.5 g/dL   Bilirubin Total 0.5 0.0 - 1.2 mg/dL   Alkaline Phosphatase 71 44 - 121 IU/L   AST 19 0 - 40 IU/L   ALT 27 0 - 44 IU/L  ANA   Collection Time: 11/13/23 11:13 AM  Result Value Ref Range   ANA Titer 1 Negative    Please Note: Comment   C-reactive protein   Collection Time: 11/13/23 11:13 AM  Result Value Ref Range   CRP <1 0 - 10 mg/L  Sed Rate (ESR)   Collection Time: 11/13/23 11:13 AM  Result Value Ref Range   Sed Rate 2 0 - 15 mm/hr  Direct LDL    Collection Time: 11/13/23 11:13 AM  Result Value Ref Range   LDL Direct 139 (H) 0 - 99 mg/dL  RPR   Collection Time: 11/13/23 11:13 AM  Result Value Ref Range   RPR Ser Ql Non Reactive Non Reactive  HIV Antibody (routine testing w rflx)   Collection Time: 11/13/23 11:13 AM  Result Value Ref Range   HIV Screen 4th Generation wRfx Non Reactive Non Reactive  Vitamin B12   Collection Time: 11/13/23 11:13 AM  Result Value Ref Range   Vitamin B-12 487 232 - 1,245 pg/mL  VITAMIN D  25 Hydroxy (Vit-D Deficiency, Fractures)   Collection Time: 11/13/23 11:13 AM  Result Value Ref Range   Vit D, 25-Hydroxy 12.2 (L) 30.0 - 100.0 ng/mL    Assessment and Plan  There are no diagnoses linked to this encounter.  No follow-ups on file.   Greig Ring, MD  "
